# Patient Record
Sex: Female | Born: 1955 | Race: White | Hispanic: No
Health system: Midwestern US, Community
[De-identification: ages and names within clinical notes are randomized; demographics above are authoritative.]

## PROBLEM LIST (undated history)

## (undated) DIAGNOSIS — K219 Gastro-esophageal reflux disease without esophagitis: Secondary | ICD-10-CM

## (undated) DIAGNOSIS — R011 Cardiac murmur, unspecified: Secondary | ICD-10-CM

## (undated) DIAGNOSIS — I10 Essential (primary) hypertension: Secondary | ICD-10-CM

## (undated) DIAGNOSIS — F419 Anxiety disorder, unspecified: Secondary | ICD-10-CM

## (undated) DIAGNOSIS — I4891 Unspecified atrial fibrillation: Secondary | ICD-10-CM

## (undated) DIAGNOSIS — O021 Missed abortion: Secondary | ICD-10-CM

## (undated) DIAGNOSIS — N6320 Unspecified lump in the left breast, unspecified quadrant: Secondary | ICD-10-CM

## (undated) DIAGNOSIS — R928 Other abnormal and inconclusive findings on diagnostic imaging of breast: Secondary | ICD-10-CM

## (undated) DIAGNOSIS — J069 Acute upper respiratory infection, unspecified: Secondary | ICD-10-CM

## (undated) HISTORY — PX: APPENDECTOMY: SHX54

## (undated) HISTORY — PX: EYE SURGERY: SHX253

## (undated) HISTORY — DX: Cardiac murmur, unspecified: R01.1

## (undated) HISTORY — PX: TONSILLECTOMY: SUR1361

## (undated) HISTORY — PX: COLONOSCOPY: SHX174

## (undated) HISTORY — PX: DIAGNOSTIC LAPAROSCOPY: SUR761

## (undated) HISTORY — PX: DILATION AND CURETTAGE OF UTERUS: SHX78

## (undated) HISTORY — PX: WISDOM TOOTH EXTRACTION: SHX21

## (undated) HISTORY — PX: TUBAL LIGATION: SHX77

## (undated) HISTORY — PX: ECTOPIC PREGNANCY SURGERY: SHX613

---

## 1998-08-18 ENCOUNTER — Encounter: Admission: RE | Admit: 1998-08-18 | Discharge: 1998-08-18 | Payer: Self-pay | Admitting: *Deleted

## 1999-04-28 ENCOUNTER — Other Ambulatory Visit: Admission: RE | Admit: 1999-04-28 | Discharge: 1999-04-28 | Payer: Self-pay | Admitting: Obstetrics and Gynecology

## 1999-09-14 ENCOUNTER — Encounter: Admission: RE | Admit: 1999-09-14 | Discharge: 1999-09-14 | Payer: Self-pay | Admitting: Family Medicine

## 1999-10-26 ENCOUNTER — Encounter: Admission: RE | Admit: 1999-10-26 | Discharge: 1999-10-26 | Payer: Self-pay | Admitting: Family Medicine

## 2000-02-23 ENCOUNTER — Other Ambulatory Visit: Admission: RE | Admit: 2000-02-23 | Discharge: 2000-02-23 | Payer: Self-pay | Admitting: Obstetrics and Gynecology

## 2000-03-29 ENCOUNTER — Encounter: Admission: RE | Admit: 2000-03-29 | Discharge: 2000-03-29 | Payer: Self-pay | Admitting: Orthopedic Surgery

## 2000-03-29 ENCOUNTER — Encounter: Payer: Self-pay | Admitting: Orthopedic Surgery

## 2003-02-26 ENCOUNTER — Other Ambulatory Visit: Admission: RE | Admit: 2003-02-26 | Discharge: 2003-02-26 | Payer: Self-pay | Admitting: Obstetrics and Gynecology

## 2004-11-19 ENCOUNTER — Other Ambulatory Visit: Admission: RE | Admit: 2004-11-19 | Discharge: 2004-11-19 | Payer: Self-pay | Admitting: Obstetrics and Gynecology

## 2007-03-25 LAB — METABOLIC PANEL, BASIC
Anion gap: 6 — ABNORMAL LOW (ref 7–16)
BUN: 13 MG/DL (ref 7–18)
CO2: 30 MMOL/L (ref 21–32)
Calcium: 9.1 MG/DL (ref 8.4–10.4)
Chloride: 104 MMOL/L (ref 98–107)
Creatinine: 0.7 MG/DL (ref 0.6–1.0)
GFR est AA: >60 mL/min/{1.73_m2} (ref >60)
GFR est non-AA: >60 mL/min/{1.73_m2} (ref >60)
Glucose: 92 MG/DL (ref 74–106)
Potassium: 3.7 MMOL/L (ref 3.5–5.1)
Sodium: 140 MMOL/L (ref 136–145)

## 2007-03-25 LAB — CBC WITH AUTOMATED DIFF
ABS. BASOPHILS: 0 10*3/uL (ref 0.0–0.2)
ABS. EOSINOPHILS: 0.1 10*3/uL (ref 0.00–0.80)
ABS. LYMPHOCYTES: 3.6 10*3/uL (ref 0.6–4.3)
ABS. MONOCYTES: 0.4 10*3/uL (ref 0.1–0.9)
ABS. NEUTROPHILS: 5.3 10*3/uL (ref 1.9–7.8)
ABSOLUTE LUC: 0.1 (ref 0.0–0.3)
BASOPHILS: 0 % — ABNORMAL LOW (ref 0.1–1.6)
EOSINOPHILS: 2 % (ref 0.5–7.8)
HCT: 45.3 % — ABNORMAL HIGH (ref 35.6–45.0)
HGB: 15.2 GM/DL — ABNORMAL HIGH (ref 11.7–15.0)
LGE UNSTAINED CELLS: 1.3 % (ref 0.9–3.1)
LYMPHOCYTES: 38 % (ref 14.7–41.3)
MCH: 29.7 PG (ref 26.1–32.9)
MCHC: 33.6 GM/DL (ref 31.4–35.0)
MCV: 88.3 FL (ref 79.6–97.8)
MONOCYTES: 4 % (ref 3.2–9.0)
NEUTROPHILS: 55 % (ref 47.0–74.6)
PLATELET: 296 10*3/uL (ref 140–440)
RBC: 5.13 M/uL (ref 3.86–5.18)
RDW: 13.3 % (ref 11.9–14.6)
WBC: 9.6 10*3/uL (ref 4.5–10.5)

## 2008-04-29 MED ORDER — HYDROMORPHONE 2 MG/ML INJECTION SOLUTION
2 mg/mL | INTRAMUSCULAR | Status: AC
Start: 2008-04-29 — End: 2008-04-29
  Administered 2008-04-29: 23:00:00 via INTRAMUSCULAR

## 2008-04-29 MED ORDER — DICLOFENAC 75 MG TAB, DELAYED RELEASE
75 mg | ORAL_TABLET | Freq: Two times a day (BID) | ORAL | Status: AC
Start: 2008-04-29 — End: 2009-04-24

## 2008-04-29 NOTE — ED Notes (Signed)
Bedside report received from Kim, RN.

## 2008-04-29 NOTE — ED Notes (Signed)
Vital signs rechecked.  Patient reassured that we would get her back soon and to notify if she needed anything else.  Patient agrees with plan.

## 2008-04-29 NOTE — ED Notes (Signed)
Bedside report to Cayce, RN.
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
## 2008-04-29 NOTE — ED Notes (Signed)
Pt awake alert voices understanding of RX and discharge instructions

## 2008-04-29 NOTE — ED Notes (Signed)
Advised of delay for a room. Also advised if they feel their condition changes, or they request a recheck, to please present to registration and I will gladly re-assess. Requested u/a.

## 2009-06-15 LAB — BLOOD GAS ARTERIAL,VENT
ALLENS TEST: POSITIVE
Arterial O2 Hgb: 90.5 % — ABNORMAL LOW (ref 94.0–97.0)
BASE EXCESS: 3.9 mmol/L — ABNORMAL HIGH (ref 0–3)
BICARBONATE: 30 mmol/L — ABNORMAL HIGH (ref 22.0–26.0)
CARBOXYHEMOGLOBIN: 1.1 % (ref 0.5–1.5)
DEOXYHEMOGLOBIN: 8 % — ABNORMAL HIGH (ref 0.0–5.0)
FIO2: 21 %
HEMATOCRIT: 39 % (ref 37–50)
METHEMOGLOBIN: 0.2 % (ref 0.0–1.5)
O2 SAT: 92 % (ref 92.0–98.5)
PCO2: 52 mmHg — ABNORMAL HIGH (ref 35.0–45.0)
PO2: 63 mmHg — ABNORMAL LOW (ref 75.0–100.0)
TOTAL HEMOGLOBIN: 13.4 GM/DL (ref 12.0–18.0)
pH: 7.38 (ref 7.35–7.45)

## 2009-06-15 LAB — CBC W/O DIFF
HCT: 41.9 % (ref 37.6–48.3)
HGB: 12.8 g/dL (ref 11.7–15.0)
MCH: 29.4 PG (ref 26.1–32.9)
MCHC: 30.5 g/dL — ABNORMAL LOW (ref 31.4–35.0)
MCV: 96.1 FL (ref 79.6–97.8)
MPV: 10.2 FL — ABNORMAL LOW (ref 10.8–14.1)
PLATELET: 233 10*3/uL (ref 140–440)
RBC: 4.36 M/uL (ref 3.86–5.18)
RDW: 13.5 % (ref 11.9–14.6)
WBC: 7.9 10*3/uL (ref 4.0–10.5)

## 2009-06-15 LAB — METABOLIC PANEL, COMPREHENSIVE
A-G Ratio: 1 — ABNORMAL LOW (ref 1.2–3.5)
ALT (SGPT): 46 U/L (ref 39–65)
AST (SGOT): 20 U/L (ref 15–37)
Albumin: 3.3 g/dL — ABNORMAL LOW (ref 3.5–5.0)
Alk. phosphatase: 88 U/L (ref 50–136)
Anion gap: 3 mmol/L — ABNORMAL LOW (ref 7–16)
BUN: 16 MG/DL (ref 7–18)
Bilirubin, total: 0.3 MG/DL (ref 0.2–1.1)
CO2: 30 MMOL/L (ref 21–32)
Calcium: 8.1 MG/DL — ABNORMAL LOW (ref 8.4–10.4)
Chloride: 105 MMOL/L (ref 98–107)
Creatinine: 1.1 MG/DL — ABNORMAL HIGH (ref 0.6–1.0)
GFR est AA: >60 mL/min/{1.73_m2} (ref >60)
GFR est non-AA: 56 mL/min/{1.73_m2} — ABNORMAL LOW (ref >60)
Globulin: 3.4 g/dL (ref 2.3–3.5)
Glucose: 103 MG/DL (ref 74–106)
Potassium: 3.8 MMOL/L (ref 3.5–5.1)
Protein, total: 6.7 g/dL (ref 6.3–8.2)
Sodium: 138 MMOL/L (ref 136–145)

## 2009-06-15 LAB — AMMONIA: Ammonia, plasma: 28 umol/L (ref 11–32)

## 2009-06-15 LAB — PROTHROMBIN TIME + INR
INR: 1 (ref 0.9–1.2)
Prothrombin time: 9.7 SECS (ref 9.4–10.8)

## 2009-06-15 LAB — LIPASE: Lipase: 126 U/L (ref 73–393)

## 2009-06-15 LAB — BNP: BNP: 25 pg/mL

## 2009-06-15 LAB — ACETAMINOPHEN: Acetaminophen level: <1 ug/mL — ABNORMAL LOW (ref 10.0–30.0)

## 2009-06-15 LAB — D DIMER: D DIMER: 0.52 ug/ml(FEU) (ref <0.55)

## 2009-06-15 LAB — D-DIMER, QUANTITATIVE: D-Dimer, Quant: 0.52 ug/ml(FEU) (ref <0.55)

## 2009-06-15 MED ORDER — SALINE PERIPHERAL FLUSH PRN
INTRAMUSCULAR | Status: DC | PRN
Start: 2009-06-15 — End: 2009-06-15

## 2009-06-15 MED ORDER — SALINE PERIPHERAL FLUSH Q8H
Freq: Three times a day (TID) | INTRAMUSCULAR | Status: DC
Start: 2009-06-15 — End: 2009-06-15

## 2009-06-15 MED ORDER — FUROSEMIDE 40 MG TAB
40 mg | ORAL_TABLET | Freq: Every day | ORAL | Status: AC
Start: 2009-06-15 — End: 2009-07-05

## 2009-06-15 MED ORDER — FAMOTIDINE 20 MG TAB
20 mg | ORAL | Status: AC
Start: 2009-06-15 — End: 2009-06-15
  Administered 2009-06-15: 20:00:00 via ORAL

## 2009-06-15 MED ORDER — FUROSEMIDE 10 MG/ML IJ SOLN
10 mg/mL | INTRAMUSCULAR | Status: AC
Start: 2009-06-15 — End: 2009-06-15
  Administered 2009-06-15: 20:00:00 via INTRAVENOUS

## 2009-06-15 MED ORDER — GI COCKTAIL
ORAL | Status: AC
Start: 2009-06-15 — End: 2009-06-15
  Administered 2009-06-15: 20:00:00 via ORAL

## 2009-06-15 MED FILL — GI COCKTAIL: ORAL | Qty: 30

## 2009-06-15 MED FILL — FUROSEMIDE 10 MG/ML IJ SOLN: 10 mg/mL | INTRAMUSCULAR | Qty: 4

## 2009-06-15 MED FILL — FAMOTIDINE 20 MG TAB: 20 mg | ORAL | Qty: 1

## 2009-06-15 NOTE — ED Notes (Signed)
Family reports she has also been having dizziness for about a week, or so. Also having memory problems- for instance, she couldn't remember where she put her pocketbook today, or that she had ironed a shirt for her brother. Pt does not feel this is an issue, but family is quite persistent.

## 2009-06-15 NOTE — ED Provider Notes (Signed)
HPI Comments: Cynthia Bauer is a 53 y.o. Caucasian female in NAD presents with c/o epigastric pain with N&V&D onset last PM. Pt c/o dizziness for a week. She also has develop swelling of her body. Pt is on lg doses of Lortab for back pain.      Patient is a 53 y.o. female presenting with epigastric pain. The history is provided by the patient.   Epigastric Pain   This is a new problem. The current episode started 12 to 24 hours ago. The problem occurs constantly. The problem has not changed since onset. The pain is located in the generalized abdominal region and epigastric region. The quality of the pain is sharp. The pain is at a severity of 8/10. Associated symptoms include diarrhea, nausea and vomiting. Pertinent negatives include no fever, no dysuria, no frequency and no chest pain. The pain is worsened by palpation. The pain is relieved by nothing.        Past Medical History   Diagnosis Date   ??? Hypertension    ??? Other ill-defined conditions      enlarged heart.    ??? Gastrointestinal disorder      GERD          Past Surgical History   Procedure Date   ??? Hx gyn      hyst '87,, BTL           No family history on file.     History   Social History   ??? Marital Status: Divorced     Spouse Name: N/A     Number of Children: N/A   ??? Years of Education: N/A   Occupational History   ??? Not on file.   Social History Main Topics   ??? Tobacco Use: Never   ??? Alcohol Use: No   ??? Drug Use:    ??? Sexually Active:    Other Topics Concern   ??? Not on file   Social History Narrative   ??? No narrative on file           ALLERGIES: Review of patient's allergies indicates no known allergies.      Review of Systems   Constitutional: Positive for chills. Negative for fever.   Respiratory: Negative for shortness of breath.    Cardiovascular: Positive for leg swelling. Negative for chest pain.   Gastrointestinal: Positive for nausea, vomiting, abdominal pain and diarrhea.   Genitourinary: Negative for dysuria and frequency.    Skin: Negative for color change.   Neurological: Positive for dizziness. Negative for speech difficulty.   All other systems reviewed and are negative.        Filed Vitals:    06/15/2009  2:11 PM 06/15/2009  2:13 PM   BP: 115/88    Pulse: 100    Temp: 98.7 ??F (37.1 ??C)    Resp: 18    Height:  5\' 6"  (1.676 m)   Weight:  260 lb (117.935 kg)   SpO2: 100%               Physical Exam   Nursing note and vitals reviewed.  Constitutional: She is oriented to person, place, and time. She appears well-developed and well-nourished. No distress.   HENT:   Head: Normocephalic and atraumatic.   Right Ear: External ear normal.   Left Ear: External ear normal.   Nose: Nose normal.   Mouth/Throat: Oropharynx is clear and moist.   Eyes: Conjunctivae and extraocular motions are normal. No scleral icterus.  Neck: Normal range of motion. Neck supple.   Cardiovascular: Normal rate, regular rhythm and intact distal pulses.    Pulmonary/Chest: Effort normal and breath sounds normal. No respiratory distress.   Abdominal: Normal appearance. There is no organomegaly, splenomegaly or hepatomegaly. Generalized and tenderness is present. She has guarding. She has no rigidity, no rebound, no CVA tenderness, no pain at McBurney's point and no Murphy's sign.       Musculoskeletal: Normal range of motion. She exhibits no tenderness.        Right ankle: She exhibits swelling.        Left ankle: She exhibits swelling.        Right lower leg: She exhibits edema.        Left lower leg: She exhibits edema.   Neurological: She is alert and oriented to person, place, and time. She has normal strength. No sensory deficit.   Skin: Skin is warm and dry. No rash noted.   Psychiatric: She has a normal mood and affect. Her speech is normal and behavior is normal. Thought content normal.        MDM Coding   Reviewed: nursing note and vitals  Interpretation: labs, ECG and x-ray        Procedures    Recent Results (from the past 24 hour(s))   CBC W/O DIFF     Collection Time 06/15/09  3:40 PM   Component Value Range   ??? WBC 7.9  4.0 - 10.5 (K/uL)   ??? RBC 4.36  3.86 - 5.18 (M/uL)   ??? HGB 12.8  11.7 - 15.0 (Khaalid Lefkowitz/dL)   ??? HCT 41.9  37.6 - 48.3 (%)   ??? MCV 96.1  79.6 - 97.8 (FL)   ??? MCH 29.4  26.1 - 32.9 (PG)   ??? MCHC 30.5 (*) 31.4 - 35.0 (Demarius Archila/dL)   ??? RDW 13.5  11.9 - 14.6 (%)   ??? PLATELET 233  140 - 440 (K/uL)   ??? MPV 10.2 (*) 10.8 - 14.1 (FL)   METABOLIC PANEL, COMPREHENSIVE    Collection Time 06/15/09  3:40 PM   Component Value Range   ??? Sodium 138  136 - 145 (MMOL/L)   ??? Potassium 3.8  3.5 - 5.1 (MMOL/L)   ??? Chloride 105  98 - 107 (MMOL/L)   ??? CO2 30  21 - 32 (MMOL/L)   ??? Anion gap 3 (*) 7 - 16 (mmol/L)   ??? Glucose 103  74 - 106 (MG/DL)   ??? BUN 16  7 - 18 (MG/DL)   ??? Creatinine 1.1 (*) 0.6 - 1.0 (MG/DL)   ??? GFR est AA >60  >60 (ml/min/1.52m2)   ??? GFR est non-AA 56 (*) >60 (ml/min/1.76m2)   ??? Calcium 8.1 (*) 8.4 - 10.4 (MG/DL)   ??? Bilirubin, total 0.3  0.2 - 1.1 (MG/DL)   ??? ALT 46  39 - 65 (U/L)   ??? AST 20  15 - 37 (U/L)   ??? Alk. phosphatase 88  50 - 136 (U/L)   ??? Protein, total 6.7  6.3 - 8.2 (Jericho Alcorn/dL)   ??? Albumin 3.3 (*) 3.5 - 5.0 (Terrina Docter/dL)   ??? Globulin 3.4  2.3 - 3.5 (Hashim Eichhorst/dL)   ??? A-Markeya Mincy Ratio 1.0 (*) 1.2 - 3.5 ( )   PROTHROMBIN TIME    Collection Time 06/15/09  3:40 PM   Component Value Range   ??? Prothrombin Time-PT 9.7  9.4 - 10.8 (SECS)   ??? INR 1.0  0.9 - 1.2 ( )  LIPASE    Collection Time 06/15/09  3:40 PM   Component Value Range   ??? Lipase 126  73 - 393 (U/L)   BNP    Collection Time 06/15/09  3:40 PM   Component Value Range   ??? BNP 25  (pg/mL)   ACETAMINOPHEN    Collection Time 06/15/09  3:40 PM   Component Value Range   ??? ACETAMINOPHEN <1 (*) 10.0 - 30.0 (ug/mL)   D DIMER    Collection Time 06/15/09  3:40 PM   Component Value Range   ??? D DIMER 0.52  <0.55 (ug/ml(FEU))   AMMONIA    Collection Time 06/15/09  3:45 PM   Component Value Range   ??? Ammonia 28  11 - 32 (UMOL/L)   BLOOD GAS, ARTERIAL    Collection Time 06/15/09  5:18 PM   Component Value Range   ??? pH 7.38  7.35 - 7.45 ( )    ??? PCO2 52 (*) 35.0 - 45.0 (mmHg)   ??? PO2 63 (*) 75.0 - 100.0 (mmHg)   ??? BICARBONATE 30 (*) 22.0 - 26.0 (mmol/L)   ??? BASE EXCESS 3.9 (*) 0 - 3 (mmol/L)   ??? TOTAL HEMOGLOBIN 13.4  12.0 - 18.0 (GM/DL)   ??? O2 SAT 92  92.0 - 98.5 (%)   ??? ARTERIAL O2 HGB 90.5 (*) 94.0 - 97.0 (%)   ??? CARBOXYHEMOGLOBIN 1.1  0.5 - 1.5 (%)   ??? METHEMOGLOBIN 0.2  0.0 - 1.5 (%)   ??? DEOXYHEMOGLOBIN 8 (*) 0.0 - 5.0 (%)   ??? HEMATOCRIT 39  37 - 50 (%)   ??? SITE LR     ??? ALLENS TEST POSITIVE     ??? MODE RA     ??? FIO2 21.0  (%)        Cardiac enzymes are neg  BNP is neg  CXR shows no acute changes

## 2009-06-15 NOTE — ED Notes (Signed)
I have reviewed discharge instructions with the patient.  The patient verbalized understanding.

## 2009-09-12 LAB — METABOLIC PANEL, COMPREHENSIVE
A-G Ratio: 1 — ABNORMAL LOW (ref 1.2–3.5)
ALT (SGPT): 41 U/L (ref 39–65)
AST (SGOT): 16 U/L (ref 15–37)
Albumin: 3.9 g/dL (ref 3.5–5.0)
Alk. phosphatase: 107 U/L (ref 50–136)
Anion gap: 10 mmol/L (ref 7–16)
BUN: 8 MG/DL (ref 7–18)
Bilirubin, total: 0.5 MG/DL (ref 0.2–1.1)
CO2: 26 MMOL/L (ref 21–32)
Calcium: 8.7 MG/DL (ref 8.4–10.4)
Chloride: 103 MMOL/L (ref 98–107)
Creatinine: 1 MG/DL (ref 0.6–1.0)
GFR est AA: >60 mL/min/{1.73_m2} (ref >60)
GFR est non-AA: >60 mL/min/{1.73_m2} (ref >60)
Globulin: 3.8 g/dL — ABNORMAL HIGH (ref 2.3–3.5)
Glucose: 131 MG/DL — ABNORMAL HIGH (ref 74–106)
Potassium: 3.1 MMOL/L — ABNORMAL LOW (ref 3.5–5.1)
Protein, total: 7.7 g/dL (ref 6.3–8.2)
Sodium: 139 MMOL/L (ref 136–145)

## 2009-09-12 LAB — CBC WITH AUTOMATED DIFF
ABS. BASOPHILS: 0 10*3/uL (ref 0.0–0.2)
ABS. EOSINOPHILS: 0.1 10*3/uL (ref 0.0–0.8)
ABS. IMM. GRANS.: 0 10*3/uL (ref 0.0–2.0)
ABS. LYMPHOCYTES: 2.6 10*3/uL (ref 0.5–4.6)
ABS. MONOCYTES: 0.4 10*3/uL (ref 0.1–1.3)
ABS. NEUTROPHILS: 4.1 10*3/uL (ref 1.7–8.2)
BASOPHILS: 0 % (ref 0.0–2.0)
EOSINOPHILS: 1 % (ref 0.5–7.8)
HCT: 49.1 % — ABNORMAL HIGH (ref 37.6–48.3)
HGB: 15.8 g/dL — ABNORMAL HIGH (ref 11.7–15.0)
IMMATURE GRANULOCYTES: 0.3 % (ref 0.0–2.0)
LYMPHOCYTES: 36 % (ref 13–44)
MCH: 29.8 PG (ref 26.1–32.9)
MCHC: 32.2 g/dL (ref 31.4–35.0)
MCV: 92.5 FL (ref 79.6–97.8)
MONOCYTES: 5 % (ref 4.0–12.0)
MPV: 10.9 FL (ref 10.8–14.1)
NEUTROPHILS: 58 % (ref 43–78)
PLATELET: 285 10*3/uL (ref 140–440)
RBC: 5.31 M/uL — ABNORMAL HIGH (ref 3.86–5.18)
RDW: 13.1 % (ref 11.9–14.6)
WBC: 7.2 10*3/uL (ref 4.0–10.5)

## 2009-09-12 LAB — LIPASE: Lipase: 126 U/L (ref 73–393)

## 2009-09-12 LAB — AMYLASE: Amylase: 34 U/L (ref 25–115)

## 2009-09-12 MED ORDER — DIATRIZOATE MEGLUMINE & SODIUM 66 %-10 % ORAL SOLN
66-10 % | Freq: Once | ORAL | Status: AC
Start: 2009-09-12 — End: 2009-09-12
  Administered 2009-09-12: 20:00:00 via ORAL

## 2009-09-12 MED ORDER — PANTOPRAZOLE 40 MG TAB, DELAYED RELEASE
40 mg | ORAL | Status: AC
Start: 2009-09-12 — End: 2009-09-12
  Administered 2009-09-12: 18:00:00 via ORAL

## 2009-09-12 MED ORDER — HYDROMORPHONE (PF) 1 MG/ML IJ SOLN
1 mg/mL | INTRAMUSCULAR | Status: AC
Start: 2009-09-12 — End: 2009-09-12
  Administered 2009-09-12: 16:00:00 via INTRAVENOUS

## 2009-09-12 MED ORDER — HYDROMORPHONE (PF) 1 MG/ML IJ SOLN
1 mg/mL | INTRAMUSCULAR | Status: AC
Start: 2009-09-12 — End: 2009-09-12
  Administered 2009-09-12: 18:00:00 via INTRAVENOUS

## 2009-09-12 MED ORDER — IOVERSOL 350 MG/ML IV SOLN
350 mg iodine/mL | Freq: Once | INTRAVENOUS | Status: AC
Start: 2009-09-12 — End: 2009-09-12
  Administered 2009-09-12: 20:00:00 via INTRAVENOUS

## 2009-09-12 MED ORDER — ONDANSETRON (PF) 4 MG/2 ML INJECTION
4 mg/2 mL | INTRAMUSCULAR | Status: AC
Start: 2009-09-12 — End: 2009-09-12
  Administered 2009-09-12: 16:00:00 via INTRAVENOUS

## 2009-09-12 MED ORDER — OMEPRAZOLE 20 MG CAP, DELAYED RELEASE
20 mg | ORAL_CAPSULE | Freq: Every day | ORAL | Status: AC
Start: 2009-09-12 — End: 2010-09-07

## 2009-09-12 MED ORDER — ONDANSETRON (PF) 4 MG/2 ML INJECTION
4 mg/2 mL | INTRAMUSCULAR | Status: AC
Start: 2009-09-12 — End: 2009-09-12
  Administered 2009-09-12: 18:00:00 via INTRAVENOUS

## 2009-09-12 MED FILL — PROTONIX 40 MG TABLET,DELAYED RELEASE: 40 mg | ORAL | Qty: 1

## 2009-09-12 MED FILL — HYDROMORPHONE (PF) 1 MG/ML IJ SOLN: 1 mg/mL | INTRAMUSCULAR | Qty: 1

## 2009-09-12 MED FILL — ONDANSETRON (PF) 4 MG/2 ML INJECTION: 4 mg/2 mL | INTRAMUSCULAR | Qty: 2

## 2009-09-12 NOTE — ED Notes (Signed)
Pt states "feels much better after being place on oxygen". Pt nad

## 2009-09-12 NOTE — ED Notes (Signed)
Report taken at bedside from Christy,RN and AIDET preformed.

## 2009-09-12 NOTE — ED Notes (Signed)
Pt aware that CT results still not returned yet and is in NAD at this time

## 2009-09-12 NOTE — ED Notes (Signed)
Normal [place] and ab series. Heme negative and normal pancreatic enzymes. Plan to treat abdominal gastritis and dc.

## 2009-09-12 NOTE — ED Notes (Signed)
Lab called to check on blood work. States "they have it down there"

## 2009-09-12 NOTE — ED Notes (Signed)
I have reviewed discharge instructions with the patient.  The patient verbalized understanding. Pt ambulatory. Family driving her home. Pt nad

## 2009-09-12 NOTE — ED Notes (Signed)
Pt resting in bed. States "feels much better". Pt nad

## 2009-09-12 NOTE — ED Notes (Signed)
Pt finished PO contrast

## 2009-09-12 NOTE — ED Notes (Signed)
Po contrast given to pt.

## 2009-09-12 NOTE — ED Provider Notes (Signed)
HPI Comments: 26 wf complains of severe epigastric pain that started yesterday. She has had chronic pain for over a year but yesterday and today it is much worse. No fever and no history of black or dark stools. She is on prilosec for GERD.    Patient is a 53 y.o. female presenting with epigastric pain. The history is provided by the patient and a relative.   Epigastric Pain   This is a chronic (over one year of pain) problem. The problem has been gradually worsening. The pain is associated with an unknown factor. The pain is located in the epigastric region. The quality of the pain is aching and sharp. The pain is at a severity of 9/10. The pain is severe. Associated symptoms include anorexia and nausea. Pertinent negatives include no fever, no belching, no diarrhea, no flatus, no hematochezia, no melena, no vomiting, no constipation, no dysuria, no frequency, no trauma and no chest pain. Nothing worsens the pain. The pain is relieved by nothing. Her past medical history is significant for GERD. The patient's surgical history includes hysterectomy.       Past Medical History   Diagnosis Date   ??? Hypertension    ??? Other ill-defined conditions      enlarged heart.    ??? Gastrointestinal disorder      GERD          Past Surgical History   Procedure Date   ??? Hx gyn      hyst '87,, BTL   ??? Hx orthopaedic      lumbar radiculopathy           No family history on file.     History   Social History   ??? Marital Status: Divorced     Spouse Name: N/A     Number of Children: N/A   ??? Years of Education: N/A   Occupational History   ??? Not on file.   Social History Main Topics   ??? Tobacco Use: Never   ??? Alcohol Use: No   ??? Drug Use:    ??? Sexually Active:    Other Topics Concern   ??? Not on file   Social History Narrative   ??? No narrative on file           ALLERGIES: Review of patient's allergies indicates no known allergies.      Review of Systems   Constitutional: Negative.  Negative for fever.   HENT: Negative.     Eyes: Negative.    Respiratory: Negative.  Negative for cough and shortness of breath.    Cardiovascular: Negative.  Negative for chest pain and leg swelling.   Gastrointestinal: Positive for nausea, abdominal pain and abdominal distention. Negative for vomiting, diarrhea, constipation, blood in stool, melena, hematochezia and anal bleeding.   Genitourinary: Negative for dysuria and frequency.   Musculoskeletal: Negative.    Skin: Negative.    Neurological: Negative.    Hematological: Negative.    Psychiatric/Behavioral: Negative.        Filed Vitals:    09/12/2009 10:35 AM 09/12/2009 10:45 AM 09/12/2009 10:46 AM 09/12/2009 11:00 AM   BP: 140/109 156/103  163/98   Pulse: 112  113 99   Temp: 98.2 ??F (36.8 ??C)      Resp: 20      Height: 5\' 7"  (1.702 m)      Weight: 270 lb (122.471 kg)      SpO2: 95%  97% 94%  Physical Exam   Nursing note and vitals reviewed.  Constitutional: She is oriented to person, place, and time. She appears well-developed and well-nourished. She appears distressed.   HENT:   Head: Normocephalic and atraumatic.   Right Ear: External ear normal.   Left Ear: External ear normal.   Nose: Nose normal.   Mouth/Throat: Oropharynx is clear and moist.   Eyes: Conjunctivae and extraocular motions are normal. Pupils are equal, round, and reactive to light.   Neck: Normal range of motion. Neck supple.   Cardiovascular: Normal rate, regular rhythm, normal heart sounds and intact distal pulses.    Pulmonary/Chest: Effort normal and breath sounds normal. No respiratory distress. She has no wheezes. She has no rales. She exhibits no tenderness.   Abdominal: Soft.             Decreased bowel sounds     Musculoskeletal: Normal range of motion.   Neurological: She is alert and oriented to person, place, and time.   Skin: Skin is warm and dry.        Coding    Procedures

## 2011-11-15 ENCOUNTER — Ambulatory Visit (INDEPENDENT_AMBULATORY_CARE_PROVIDER_SITE_OTHER): Payer: BC Managed Care – PPO

## 2011-11-15 DIAGNOSIS — J069 Acute upper respiratory infection, unspecified: Secondary | ICD-10-CM

## 2011-12-08 ENCOUNTER — Ambulatory Visit (INDEPENDENT_AMBULATORY_CARE_PROVIDER_SITE_OTHER): Payer: BC Managed Care – PPO | Admitting: Physician Assistant

## 2011-12-08 VITALS — BP 131/82 | HR 69 | Temp 98.0°F | Resp 16 | Ht 63.0 in | Wt 176.0 lb

## 2011-12-08 DIAGNOSIS — J329 Chronic sinusitis, unspecified: Secondary | ICD-10-CM

## 2011-12-08 DIAGNOSIS — I10 Essential (primary) hypertension: Secondary | ICD-10-CM

## 2011-12-08 MED ORDER — FLUTICASONE PROPIONATE 50 MCG/ACT NA SUSP: 2.0000 | Freq: Every day | NASAL | Status: DC

## 2011-12-08 MED ORDER — CEFDINIR 300 MG PO CAPS: 600.0000 mg | ORAL_CAPSULE | Freq: Every day | ORAL | Status: AC

## 2011-12-08 NOTE — Progress Notes (Signed)
  Subjective:    Patient ID: Cassandra Hicks, female    DOB: 01-27-1956, 56 y.o.   MRN: [id number]  HPI 56 y/o WF presents with URI symptoms ongoing for three weeks. Chills two nights ago, now resolved.  No objective fever. Nasal congestion, sinus pain, ear pain, right > left Went to [place] last week - plane ride, cold weather Minimal cough, no wheeze  AlkaSeltzer, Tylenol, sinus pills, nasal spray all without relief  Seen 11/15/2011 for similar symptoms, took azithromycin, improved slightly, but never completely resolved.    Review of Systems  Constitutional: Positive for fatigue. Negative for fever and chills.  HENT: Positive for ear pain (Right), sore throat, postnasal drip and sinus pressure. Negative for neck pain.   Respiratory: Negative for cough, shortness of breath and wheezing.   Neurological: Negative for dizziness.       Objective:   Physical Exam  Constitutional: She appears well-developed and well-nourished. No distress (but looks ill).  HENT:  Head: Normocephalic and atraumatic.  Right Ear: Tympanic membrane is scarred, erythematous and retracted. Tympanic membrane mobility is abnormal. A middle ear effusion is present.  Left Ear: Tympanic membrane and external ear normal.  Mouth/Throat: No oropharyngeal exudate.  Eyes: Pupils are equal, round, and reactive to light.  Neck: Normal range of motion.  Cardiovascular: Normal rate, regular rhythm and normal heart sounds.   Pulmonary/Chest: Effort normal and breath sounds normal.  Skin: Skin is warm and dry.          Assessment & Plan:  Sinusitis.  Right otitis media Treat with Omnicef 300mg  two tablets daily for 10 days.  Take all 10 days of antibiotics to prevent recurrence.  Use nasal spray (Flonase) daily for symptom relief.  May use ibuprofen (Advil) for myalgias and sinus pain.   If not improved after antibiotics, advise return for recheck.  Consider ENT referral or sinus films to evaluate for chronic  sinusitis.

## 2011-12-08 NOTE — Patient Instructions (Addendum)
Take all 10 days of antibiotics to prevent recurrence.  Use nasal spray daily for symptom relief.  May use ibuprofen (Advil) for myalgias and sinus pain.   Sinusitis Sinuses are air pockets within the bones of your face. The growth of bacteria within a sinus leads to infection. The infection prevents the sinuses from draining. This infection is called sinusitis. SYMPTOMS  There will be different areas of pain depending on which sinuses have become infected.  The maxillary sinuses often produce pain beneath the eyes.   Frontal sinusitis may cause pain in the middle of the forehead and above the eyes.  Other problems (symptoms) include:  Toothaches.   Colored, pus-like (purulent) drainage from the nose.   Swelling, warmth, and tenderness over the sinus areas may be signs of infection.  TREATMENT  Sinusitis is most often determined by an exam.X-rays may be taken. If x-rays have been taken, make sure you obtain your results or find out how you are to obtain them. Your caregiver may give you medications (antibiotics). These are medications that will help kill the bacteria causing the infection. You may also be given a medication (decongestant) that helps to reduce sinus swelling.  HOME CARE INSTRUCTIONS   Only take over-the-counter or prescription medicines for pain, discomfort, or fever as directed by your caregiver.   Drink extra fluids. Fluids help thin the mucus so your sinuses can drain more easily.   Applying either moist heat or ice packs to the sinus areas may help relieve discomfort.   Use saline nasal sprays to help moisten your sinuses. The sprays can be found at your local drugstore.  SEEK IMMEDIATE MEDICAL CARE IF:  You have a fever.   You have increasing pain, severe headaches, or toothache.   You have nausea, vomiting, or drowsiness.   You develop unusual swelling around the face or trouble seeing.  MAKE SURE YOU:   Understand these instructions.   Will watch your  condition.   Will get help right away if you are not doing well or get worse.  Document Released: 10/25/2005 Document Revised: 07/07/2011 Document Reviewed: 05/24/2007 [facility] Patient Information 2012 [place], [place].

## 2012-02-15 ENCOUNTER — Other Ambulatory Visit: Payer: Self-pay | Admitting: Physician Assistant

## 2012-03-16 ENCOUNTER — Other Ambulatory Visit: Payer: Self-pay | Admitting: Physician Assistant

## 2012-03-17 ENCOUNTER — Telehealth: Payer: Self-pay

## 2012-03-17 MED ORDER — METOPROLOL SUCCINATE ER 25 MG PO TB24: 25.0000 mg | ORAL_TABLET | Freq: Every day | ORAL | Status: DC

## 2012-03-17 NOTE — Telephone Encounter (Signed)
Can we give these refills or does she need to come into clinic first?

## 2012-03-17 NOTE — Telephone Encounter (Signed)
Spoke with patient and let her know rx was called in and she would need to rtc before getting more refills.

## 2012-03-17 NOTE — Telephone Encounter (Signed)
Pt out of htn meds and is going out of town this weekend.Uses [facility] on [address]. Advised she needs to make an appt but would like some called in to carry her over as she is going out of town.  Connelly  [phone]

## 2012-03-17 NOTE — Telephone Encounter (Signed)
done

## 2012-03-17 NOTE — Telephone Encounter (Signed)
LMOM to call back

## 2012-04-05 ENCOUNTER — Ambulatory Visit (INDEPENDENT_AMBULATORY_CARE_PROVIDER_SITE_OTHER): Payer: BC Managed Care – PPO | Admitting: Family Medicine

## 2012-04-05 VITALS — BP 165/99 | HR 65 | Temp 98.5°F | Resp 16 | Ht 62.5 in | Wt 179.2 lb

## 2012-04-05 DIAGNOSIS — F411 Generalized anxiety disorder: Secondary | ICD-10-CM

## 2012-04-05 DIAGNOSIS — R609 Edema, unspecified: Secondary | ICD-10-CM

## 2012-04-05 DIAGNOSIS — F419 Anxiety disorder, unspecified: Secondary | ICD-10-CM

## 2012-04-05 DIAGNOSIS — I10 Essential (primary) hypertension: Secondary | ICD-10-CM

## 2012-04-05 MED ORDER — METOPROLOL SUCCINATE ER 50 MG PO TB24: 50.0000 mg | ORAL_TABLET | Freq: Every day | ORAL | Status: DC

## 2012-04-05 MED ORDER — FLUOXETINE HCL 20 MG PO TABS: 20.0000 mg | ORAL_TABLET | Freq: Every day | ORAL | Status: DC

## 2012-04-05 MED ORDER — HYDROCHLOROTHIAZIDE 25 MG PO TABS: ORAL_TABLET | ORAL | Status: DC

## 2012-04-05 NOTE — Progress Notes (Signed)
@UMFCLOGO @  Patient ID: Cassandra Hicks MRN: [id number], DOB: 1956-01-31, 56 y.o. Date of Encounter: 04/05/2012, 6:20 PM  Primary Physician: No primary provider on file.  Chief Complaint: Medication refill   HPI: 56 y.o. year old female with history below presents for refill of  Doing well without issues or complaints. Taking medication daily without adverse effects. Has been on medication for  No past medical history on file.   Home Meds: Prior to Admission medications   Medication Sig Start Date End Date Taking? Authorizing Provider  metoprolol succinate (TOPROL-XL) 25 MG 24 hr tablet Take 1 tablet (25 mg total) by mouth daily. NEEDS OFFICE VISIT/LABS 03/17/12  Yes Ryan M Dunn, PA-C  fluticasone [facility]) 50 MCG/ACT nasal spray Place 2 sprays into the nose daily. 12/08/11 12/07/12  Windy Carina, PA-C    Allergies:  Allergies  Allergen Reactions  . Penicillins Itching    History   Social History  . Marital Status: Married    Spouse Name: N/A    Number of Children: N/A  . Years of Education: N/A   Occupational History  . Not on file.   Social History Main Topics  . Smoking status: Former Smoker    Quit date: 12/07/1996  . Smokeless tobacco: Not on file  . Alcohol Use: Not on file  . Drug Use: Not on file  . Sexually Active: Not on file   Other Topics Concern  . Not on file   Social History Narrative  . No narrative on file     Review of Systems: Constitutional: negative for chills, fever, night sweats, weight changes, or fatigue  HEENT: negative for vision changes, hearing loss, congestion, rhinorrhea, ST, epistaxis, or sinus pressure Cardiovascular: negative for chest pain or palpitations Respiratory: negative for hemoptysis, wheezing, shortness of breath, or cough Abdominal: negative for abdominal pain, nausea, vomiting, diarrhea, or constipation Dermatological: negative for rash Neurologic: negative for headache, dizziness, or syncope All other systems reviewed  and are otherwise negative with the exception to those above and in the HPI.   Physical Exam: Blood pressure 165/99, pulse 65, temperature 98.5 F (36.9 C), temperature source Oral, resp. rate 16, height 5' 2.5" (1.588 m), weight 179 lb 3.2 oz (81.285 kg)., Body mass index is 32.25 kg/(m^2). General: Well developed, well nourished, in no acute distress. Head: Normocephalic, atraumatic, eyes without discharge, sclera non-icteric, nares are without discharge. Bilateral auditory canals clear, TM's are without perforation, pearly grey and translucent with reflective cone of light bilaterally. Oral cavity moist, posterior pharynx without exudate, erythema, peritonsillar abscess, or post nasal drip.  Neck: Supple. No thyromegaly. Full ROM. No lymphadenopathy. Lungs: Clear bilaterally to auscultation without wheezes, rales, or rhonchi. Breathing is unlabored. Heart: RRR with S1 S2. No murmurs, rubs, or gallops appreciated. Abdomen: Soft, non-tender, non-distended with normoactive bowel sounds. No hepatosplenomegalymegaly. No rebound/guarding. No obvious abdominal masses. Msk:  Strength and tone normal for age. Extremities/Skin: Warm and dry. No clubbing or cyanosis. No edema. No rashes or suspicious lesions. Neuro: Alert and oriented X 3. Moves all extremities spontaneously. Gait is normal. CNII-XII grossly in tact. Psych:  Responds to questions appropriately with a normal affect.   Labs:   ASSESSMENT AND PLAN:   -  Signed, Elvina Sidle, MD 04/05/2012 6:20 PM   Patient had requested to see me in end up being seen by Dr. Milus Glazier  first. She's been having some problems with anxiety and she would like to go back on Prozac. Arms with a blood pressure and  some headaches. She also gets swelling in her feet.   Objective: No acute distress. Throat clear. Neck supple without nodes. Chest clear. Heart regular without murmurs.  Trace  edema.  Assessment: Hypertension Edema Anxiety  Plan: Prozac. She is trying to decide whether she wants to start back on do not, I will give her the prescription for 20 mg daily. Return in 6 weeks for recheck of her blood pressure. Increase metoprolol to 50 mg daily. Add hydrochlorothiazide 25 mg daily. Coliforms. Thank you

## 2012-04-05 NOTE — Patient Instructions (Signed)

## 2012-09-07 ENCOUNTER — Other Ambulatory Visit: Payer: Self-pay | Admitting: Family Medicine

## 2012-09-07 NOTE — Telephone Encounter (Signed)
Per last visit in May 2013, pt to follow-up in 6 weeks for recheck, needs labs/OV

## 2012-10-16 ENCOUNTER — Other Ambulatory Visit: Payer: Self-pay | Admitting: Physician Assistant

## 2012-11-12 ENCOUNTER — Ambulatory Visit (INDEPENDENT_AMBULATORY_CARE_PROVIDER_SITE_OTHER): Payer: BC Managed Care – PPO | Admitting: Family Medicine

## 2012-11-12 VITALS — BP 148/88 | HR 63 | Temp 98.0°F | Resp 16 | Ht 63.0 in | Wt 178.0 lb

## 2012-11-12 DIAGNOSIS — I10 Essential (primary) hypertension: Secondary | ICD-10-CM

## 2012-11-12 DIAGNOSIS — Z23 Encounter for immunization: Secondary | ICD-10-CM

## 2012-11-12 MED ORDER — METOPROLOL SUCCINATE ER 50 MG PO TB24: ORAL_TABLET | ORAL | Status: DC

## 2012-11-12 MED ORDER — HYDROCHLOROTHIAZIDE 25 MG PO TABS: 25.0000 mg | ORAL_TABLET | Freq: Every day | ORAL | Status: DC

## 2012-11-12 NOTE — Addendum Note (Signed)
Addended by: Celene Squibb on: 11/12/2012 03:25 PM   Modules accepted: Orders

## 2012-11-12 NOTE — Patient Instructions (Signed)
Return in 3-4 months for a physical.   Work hard on trying to eat less and exercise more.

## 2012-11-12 NOTE — Progress Notes (Signed)
Subjective: Patient is here for a regular visit with regard to her blood pressure medication. She has felt like her blood pressure was up some of the time based her headaches. She does have a good deal of stress in her job.  Objective: Moderately overweight white female in no major distress. Chest clear. Heart regular without murmurs. Repeat blood pressure was 152 over 90   Assessment: Hypertension  Plan: Return for a physical sometime later in the spring. Increase the metoprolol to 100 mg daily (50 mg twice a day)

## 2013-07-25 ENCOUNTER — Other Ambulatory Visit: Payer: Self-pay | Admitting: Family Medicine

## 2013-08-23 ENCOUNTER — Other Ambulatory Visit: Payer: Self-pay | Admitting: Family Medicine

## 2013-09-17 ENCOUNTER — Ambulatory Visit (INDEPENDENT_AMBULATORY_CARE_PROVIDER_SITE_OTHER): Payer: BC Managed Care – PPO | Admitting: Family Medicine

## 2013-09-17 VITALS — BP 120/80 | HR 60 | Temp 98.3°F | Resp 16 | Ht 63.0 in | Wt 181.0 lb

## 2013-09-17 DIAGNOSIS — I10 Essential (primary) hypertension: Secondary | ICD-10-CM

## 2013-09-17 DIAGNOSIS — Z13220 Encounter for screening for lipoid disorders: Secondary | ICD-10-CM

## 2013-09-17 LAB — POCT CBC
Granulocyte percent: 51.7 %G (ref 37–80)
HCT, POC: 42.8 % (ref 37.7–47.9)
Hemoglobin: 13.6 g/dL (ref 12.2–16.2)
MCV: 97.4 fL — AB (ref 80–97)
POC LYMPH PERCENT: 41 %L (ref 10–50)
RBC: 4.39 M/uL (ref 4.04–5.48)

## 2013-09-17 MED ORDER — HYDROCHLOROTHIAZIDE 25 MG PO TABS: 25.0000 mg | ORAL_TABLET | Freq: Every day | ORAL | Status: DC

## 2013-09-17 MED ORDER — METOPROLOL SUCCINATE ER 50 MG PO TB24: ORAL_TABLET | ORAL | Status: DC

## 2013-09-17 NOTE — Progress Notes (Signed)
[facility] [address] [place] [address] [phone]  Date:  09/17/2013   Name:  Cassandra Hicks   DOB:  25-Nov-1955   MRN:  [id number]  PCP:  Janace Hoard, MD    Chief Complaint: Medication Refill   History of Present Illness:  Cassandra Hicks is a 57 y.o. very pleasant female patient who presents with the following:  She is here today for a BP medication refill.   She has decreased her toprol to 50 mg a day.  Reports that her BP has looked ok; she has it checked at work by the nurse and her control has been very good.   She last ate about 6 hours ago.    Her GM did have heart disease but she is not aware of any other family history of cardiac problems.    Overall she is feeling well but admits that she needs to catch up on her mammogram and colonoscopy  Patient Active Problem List   Diagnosis Date Noted  . HTN (hypertension) 12/08/2011    Past Medical History  Diagnosis Date  . Heart murmur     Past Surgical History  Procedure Laterality Date  . Appendectomy    . Eye surgery    . Ectopic pregnancy surgery      History  Substance Use Topics  . Smoking status: Former Smoker    Quit date: 12/07/1996  . Smokeless tobacco: Not on file  . Alcohol Use: Yes    Family History  Problem Relation Age of Onset  . COPD Mother   . Cancer Father     lung  . Hypertension Father   . Hypertension Sister   . Hypertension Brother     Allergies  Allergen Reactions  . Penicillins Itching    Medication list has been reviewed and updated.  Current Outpatient Prescriptions on File Prior to Visit  Medication Sig Dispense Refill  . hydrochlorothiazide (HYDRODIURIL) 25 MG tablet Take 1 tablet (25 mg total) by mouth daily. PATIENT NEEDS OFFICE VISIT FOR ADDITIONAL REFILLS - 2nd NOTICE  15 tablet  0  . metoprolol succinate (TOPROL-XL) 50 MG 24 hr tablet One twice daily for blood pressure  60 tablet  5  . FLUoxetine (PROZAC) 20 MG tablet Take 1 tablet  (20 mg total) by mouth daily.  30 tablet  3  . fluticasone (FLONASE) 50 MCG/ACT nasal spray Place 2 sprays into the nose daily.  16 g  6   No current facility-administered medications on file prior to visit.    Review of Systems:  As per HPI- otherwise negative.   Physical Examination: Filed Vitals:   09/17/13 1751  BP: 120/80  Pulse: 60  Temp: 98.3 F (36.8 C)  Resp: 16   Filed Vitals:   09/17/13 1751  Height: 5\' 3"  (1.6 m)  Weight: 181 lb (82.101 kg)   Body mass index is 32.07 kg/(m^2). Ideal Body Weight: Weight in (lb) to have BMI = 25: 140.8  GEN: WDWN, NAD, Non-toxic, A & O x 3, overweight, looks well HEENT: Atraumatic, Normocephalic. Neck supple. No masses, No LAD. Ears and Nose: No external deformity. CV: RRR, No M/G/R. No JVD. No thrill. No extra heart sounds. PULM: CTA B, no wheezes, crackles, rhonchi. No retractions. No resp. distress. No accessory muscle use. EXTR: No c/c/e NEURO Normal gait.  PSYCH: Normally interactive. Conversant. Not depressed or anxious appearing.  Calm demeanor. ;   Assessment and Plan: HTN (hypertension) - Plan: hydrochlorothiazide (  HYDRODIURIL) 25 MG tablet, metoprolol succinate (TOPROL-XL) 50 MG 24 hr tablet, POCT CBC, Comprehensive metabolic panel, TSH  Screening for hyperlipidemia - Plan: Lipid panel  Refilled her medications Labs pending as above.   Gave hand- out regarding calcium and vit d See patient instructions for more details.     Signed Abbe Amsterdam, MD

## 2013-09-17 NOTE — Patient Instructions (Signed)
I will be in touch with you regarding your labs.   Let's plan to recheck in about 6 months.    Please get back in touch with Dr. Loreta Ave about your colonoscopy  [facility]: Anselmo Rod MD Address: [address], [place], [place] [address] [phone]  Also, get your mammogram caught up when you can!

## 2013-09-18 ENCOUNTER — Encounter: Payer: Self-pay | Admitting: Family Medicine

## 2013-09-18 LAB — COMPREHENSIVE METABOLIC PANEL
Albumin: 4.1 g/dL (ref 3.5–5.2)
BUN: 12 mg/dL (ref 6–23)
CO2: 26 mEq/L (ref 19–32)
Calcium: 9.2 mg/dL (ref 8.4–10.5)
Chloride: 107 mEq/L (ref 96–112)
Glucose, Bld: 79 mg/dL (ref 70–99)
Potassium: 4.3 mEq/L (ref 3.5–5.3)

## 2013-09-18 LAB — LIPID PANEL
HDL: 46 mg/dL (ref >39)
Triglycerides: 241 mg/dL — ABNORMAL HIGH (ref <150)

## 2013-09-24 ENCOUNTER — Telehealth: Payer: Self-pay

## 2013-09-24 NOTE — Telephone Encounter (Signed)
Patient would like a copy of her lab results from last week.  After Dr Patsy Lager has reviewed the lab results.   Best#: [phone]

## 2013-09-25 NOTE — Telephone Encounter (Signed)
Checked and Dr. Patsy Lager still has not reviewed yet.

## 2013-09-27 NOTE — Telephone Encounter (Signed)
Lab results mailed to home address.

## 2014-04-18 LAB — CBC WITH AUTOMATED DIFF
ABS. BASOPHILS: 0 10*3/uL (ref 0.0–0.2)
ABS. EOSINOPHILS: 0.2 10*3/uL (ref 0.0–0.8)
ABS. IMM. GRANS.: 0 10*3/uL (ref 0.0–0.5)
ABS. LYMPHOCYTES: 3.2 10*3/uL (ref 0.5–4.6)
ABS. MONOCYTES: 0.5 10*3/uL (ref 0.1–1.3)
ABS. NEUTROPHILS: 3 10*3/uL (ref 1.7–8.2)
BASOPHILS: 0 % (ref 0.0–2.0)
EOSINOPHILS: 3 % (ref 0.5–7.8)
HCT: 44.3 % (ref 35.8–46.3)
HGB: 14.1 g/dL (ref 11.7–15.4)
IMMATURE GRANULOCYTES: 0.1 % (ref 0.0–5.0)
LYMPHOCYTES: 47 % — ABNORMAL HIGH (ref 13–44)
MCH: 29 PG (ref 26.1–32.9)
MCHC: 31.8 g/dL (ref 31.4–35.0)
MCV: 91 FL (ref 79.6–97.8)
MONOCYTES: 7 % (ref 4.0–12.0)
MPV: 10.1 FL — ABNORMAL LOW (ref 10.8–14.1)
NEUTROPHILS: 43 % (ref 43–78)
PLATELET: 280 10*3/uL (ref 150–450)
RBC: 4.87 M/uL (ref 4.05–5.25)
RDW: 13.4 % (ref 11.9–14.6)
WBC: 7 10*3/uL (ref 4.3–11.1)

## 2014-04-18 LAB — METABOLIC PANEL, COMPREHENSIVE
A-G Ratio: 1.1 — ABNORMAL LOW (ref 1.2–3.5)
ALT (SGPT): 29 U/L (ref 12–65)
AST (SGOT): 13 U/L — ABNORMAL LOW (ref 15–37)
Albumin: 3.8 g/dL (ref 3.5–5.0)
Alk. phosphatase: 80 U/L (ref 50–136)
Anion gap: 4 mmol/L — ABNORMAL LOW (ref 7–16)
BUN: 14 MG/DL (ref 6–23)
Bilirubin, total: 0.4 MG/DL (ref 0.2–1.1)
CO2: 33 mmol/L — ABNORMAL HIGH (ref 21–32)
Calcium: 9.4 MG/DL (ref 8.3–10.4)
Chloride: 103 mmol/L (ref 98–107)
Creatinine: 0.84 MG/DL (ref 0.6–1.0)
GFR est AA: >60 mL/min/{1.73_m2} (ref >60)
GFR est non-AA: >60 mL/min/{1.73_m2} (ref >60)
Globulin: 3.4 g/dL (ref 2.3–3.5)
Glucose: 118 mg/dL — ABNORMAL HIGH (ref 65–100)
Potassium: 4 mmol/L (ref 3.5–5.1)
Protein, total: 7.2 g/dL (ref 6.3–8.2)
Sodium: 140 mmol/L (ref 136–145)

## 2014-04-18 LAB — LIPASE: Lipase: 465 U/L — ABNORMAL HIGH (ref 73–393)

## 2014-04-18 MED ORDER — SODIUM CHLORIDE 0.9% BOLUS IV
0.9 % | Freq: Once | INTRAVENOUS | Status: AC
Start: 2014-04-18 — End: 2014-04-18
  Administered 2014-04-18: 17:00:00 via INTRAVENOUS

## 2014-04-18 MED ORDER — HYDROMORPHONE (PF) 1 MG/ML IJ SOLN
1 mg/mL | INTRAMUSCULAR | Status: AC
Start: 2014-04-18 — End: 2014-04-18
  Administered 2014-04-18: 17:00:00 via INTRAVENOUS

## 2014-04-18 MED ORDER — PROMETHAZINE 25 MG TAB
25 mg | ORAL_TABLET | Freq: Four times a day (QID) | ORAL | Status: AC | PRN
Start: 2014-04-18 — End: ?

## 2014-04-18 MED ORDER — ONDANSETRON (PF) 4 MG/2 ML INJECTION
4 mg/2 mL | INTRAMUSCULAR | Status: AC
Start: 2014-04-18 — End: 2014-04-18
  Administered 2014-04-18: 17:00:00 via INTRAVENOUS

## 2014-04-18 MED ORDER — DICYCLOMINE 20 MG TAB
20 mg | ORAL_TABLET | Freq: Four times a day (QID) | ORAL | Status: AC
Start: 2014-04-18 — End: 2014-04-23

## 2014-04-18 MED ORDER — OXYCODONE 5 MG TAB
5 mg | ORAL_TABLET | Freq: Four times a day (QID) | ORAL | Status: AC | PRN
Start: 2014-04-18 — End: ?

## 2014-04-18 MED FILL — ONDANSETRON (PF) 4 MG/2 ML INJECTION: 4 mg/2 mL | INTRAMUSCULAR | Qty: 2

## 2014-04-18 MED FILL — HYDROMORPHONE (PF) 1 MG/ML IJ SOLN: 1 mg/mL | INTRAMUSCULAR | Qty: 1

## 2014-04-18 NOTE — ED Notes (Signed)
Pt discharged home with rx x3, instructions to follow up with GI [place] and to follow clear liquid/low fiber diet. Pt verbalized understanding of all instructions and questions answered prior to discharge. All belongings taken with pt. Pt ambulatory out of department w/o difficulty. Accompanied home by family member x1.

## 2014-04-18 NOTE — ED Notes (Signed)
Pt given pain medication. States that she wears CPAP at home. Placed on O2 2L NC after O2 sats dropped to 89%. Pt aware of US order. Family member remains at bedside.

## 2014-04-18 NOTE — ED Notes (Signed)
GI at bedside speaking with patient.

## 2014-04-18 NOTE — ED Notes (Signed)
Pt here with abd pain radiating into back for 3 weeks, [facility] and [facility], told she had pancreatitis but they could not do any thing about it per pt.

## 2014-04-18 NOTE — ED Notes (Signed)
Dr. Leonel RamsayFelder at bedside talking with patient about plan of care. Per Merita NortonAdrianne, RN, VO from Dr. Leonel RamsayFelder to cancel US.

## 2014-04-18 NOTE — Consults (Addendum)
[facility] Consult Note       Referring Physician:  [facility] MD: Dr. Leonel RamsayFelder  Consulting Physician: Erlene SentersIsaac Eldrick Penick, MD    Consult Date:  04/18/2014    Admit Date:  04/18/2014    Chief Complaint:  Epigastric pain    Subjective:     History of Present Illness:  Patient is a 58 y.o. female with PMH listed below who is seen in consultation at the request of Dr. Leonel RamsayFelder for the reason above. Pt reports having had severe constant sharp LUQ abdominal pain that radiates to the epigastric area, worse after meals, alleviated by taking pain medications, & associated with dry heaving, but no vomiting. States that she has [facility] times for same. At one point, she was admitted for 3 days for further evaluation of abdominal pain. Labs & [facility]. Labs on 5/27 were normal except for lipase that was 4x normal. CT scan on 5/26 showed no acute abnormality. U/S on 5/30 showed fatty liver. She saw Dr. Chinita GreenlandVeerabagu, [place] this morning for the ongoing abdominal pain. According to the pt, he had plans on [facility], but pt refused & ended up here in the ER instead. [facility] were normal except for Lipase of 465 (upper normal limit is 393). No fever. Upon further query, eating broccoli & cabbage causes severe abdominal pain with bloating. She gives an alternating & constipation hx. No rectal bleeding or melena. [place], [place], does not recall the name of the endoscopist, but stomach ulcers were found. Rare NSAIDs use. On Prilosec 20mg  every day with good relief of GERD. She had a normal colonoscopy with Dr. Waynette ButteryGreer in 2008. No wt loss.         PMH:  Past Medical History   Diagnosis Date   ??? Hypertension    ??? Other ill-defined conditions(799.89)      enlarged heart.    ??? Gastrointestinal disorder      GERD       PSH:  Past Surgical History   Procedure Laterality Date   ??? Hx gyn       hyst '87,, BTL   ??? Hx orthopaedic        lumbar radiculopathy       Allergies:  Allergies   Allergen Reactions   ??? Neurontin [Gabapentin] Hives       Home Medications:  Prior to Admission medications    Medication Sig Start Date End Date Taking? Authorizing Provider   ALPRAZolam Prudy Feeler(XANAX) 0.5 mg tablet Take  by mouth.   Yes Phys Other, MD   azithromycin (ZITHROMAX) 250 mg tablet Take 250 mg by mouth See Admin Instructions. Take two tablets today then one tablet daily   Yes Phys Other, MD   clonazePAM (KLONOPIN) 1 mg tablet Take 1 mg by mouth two (2) times a day.   Yes Phys Other, MD   cyclobenzaprine (FLEXERIL) 10 mg tablet Take 10 mg by mouth three (3) times daily as needed for Muscle Spasm(s).   Yes Phys Other, MD   roflumilast (DALIRESP) tab tablet Take 500 mcg by mouth daily.   Yes Phys Other, MD   ergocalciferol (ERGOCALCIFEROL) 50,000 unit capsule Take 50,000 Units by mouth.   Yes Phys Other, MD   HYDROcodone-acetaminophen (NORCO) 10-325 mg tablet Take 2 Tabs by mouth every eight (8) hours as needed for Pain.   Yes Phys Other, MD  lisinopril-hydrochlorothiazide (PRINZIDE, ZESTORETIC) 10-12.5 mg per tablet Take 2 Tabs by mouth daily.   Yes Phys Other, MD   montelukast (SINGULAIR) 10 mg tablet Take 10 mg by mouth daily.   Yes Phys Other, MD   mometasone (NASONEX) 50 mcg/actuation nasal spray 2 Sprays daily.   Yes Phys Other, MD   omeprazole (PRILOSEC) 20 mg capsule Take 20 mg by mouth daily.   Yes Phys Other, MD   pravastatin (PRAVACHOL) 10 mg tablet Take 10 mg by mouth nightly.   Yes Phys Other, MD       Hospital Medications:  No current facility-administered medications for this encounter.     Current Outpatient Prescriptions   Medication Sig   ??? ALPRAZolam (XANAX) 0.5 mg tablet Take  by mouth.   ??? azithromycin (ZITHROMAX) 250 mg tablet Take 250 mg by mouth See Admin Instructions. Take two tablets today then one tablet daily   ??? clonazePAM (KLONOPIN) 1 mg tablet Take 1 mg by mouth two (2) times a day.   ??? cyclobenzaprine (FLEXERIL) 10 mg tablet  Take 10 mg by mouth three (3) times daily as needed for Muscle Spasm(s).   ??? roflumilast (DALIRESP) tab tablet Take 500 mcg by mouth daily.   ??? ergocalciferol (ERGOCALCIFEROL) 50,000 unit capsule Take 50,000 Units by mouth.   ??? HYDROcodone-acetaminophen (NORCO) 10-325 mg tablet Take 2 Tabs by mouth every eight (8) hours as needed for Pain.   ??? lisinopril-hydrochlorothiazide (PRINZIDE, ZESTORETIC) 10-12.5 mg per tablet Take 2 Tabs by mouth daily.   ??? montelukast (SINGULAIR) 10 mg tablet Take 10 mg by mouth daily.   ??? mometasone (NASONEX) 50 mcg/actuation nasal spray 2 Sprays daily.   ??? omeprazole (PRILOSEC) 20 mg capsule Take 20 mg by mouth daily.   ??? pravastatin (PRAVACHOL) 10 mg tablet Take 10 mg by mouth nightly.       Social History:  History   Substance Use Topics   ??? Smoking status: Quit smoking many yrs ago   ??? Smokeless tobacco: Never Used   ??? Alcohol Use: No         Family History:  Maternal GF with colon CA    Review of Systems:  A detailed 10 system ROS is obtained, with pertinent positives as listed above.  All others are negative.        Objective:     Physical Exam:  Vitals:  BP 134/72   Pulse 84   Temp(Src) 98.4 ??F (36.9 ??C)   Resp 16   Ht 5\' 7"  (1.702 m)   Wt 121.564 kg (268 lb)   BMI 41.96 kg/m2   SpO2 98%  Gen:  Pt is alert, cooperative, no acute distress  Skin:  Extremities and face reveal no rashes. No palmer erythema. No telangiectasias on the chest wall.  HEENT: Sclerae anicteric.  Extra-occular muscles are intact.  No oral ulcers.  No abnormal pigmentation of the lips.  The neck is supple.  Cardiovascular: Regular rate and rhythm. No murmurs, gallops, or rubs.  Respiratory:  Comfortable breathing with no accessory muscle use. Clear breath sounds anteriorly with no wheezes, rales, or rhonchi.  GI:  Abdomen is OBESE, soft, and MILDLY TTP EPIGASTRIC without G/R.  Normal active bowel sounds. No enlargement of the liver or spleen. No masses palpable.  Rectal:  Deferred  Musculoskeletal:  No pitting  edema of the lower legs.  Extremities have good range of motion.  No costovertebral tenderness.  Neurological:  Gross memory appears intact.  Patient is alert and  oriented.  Psychiatric:  Mood appears appropriate with judgement intact.  Lymphatic:  No cervical or supraclavicular adenopathy.    Laboratory:    Recent Labs      04/18/14   1040   WBC  7.0   HGB  14.1   HCT  44.3   PLT  280   MCV  91.0   NA  140   K  4.0   CL  103   CO2  33*   BUN  14   CREA  0.84   CA  9.4   GLU  118*   AP  80   SGOT  13*   ALT  29   TBILI  0.4   ALB  3.8   TP  7.2   LPSE  465*          Assessment:       Active Problems:  Epigastric pain  Nausea      Plan:       58 y/o female who presents with a 3 wk hx of epigastric pain w/ dry heaving. She has [facility] & admitted once for same. Recent CT scan & U/S were negative. The mildly elevated lipase is of no clinical significance. Given the severe exacerbation of abdominal pain with certain foods, alternating bowel habits, & bloating, suspect  GI sxs are functionally related / IBS. There are no alarm features to suggest any acute worrisome etiology nor warrant hospital admission. Recommend that she f/u with her GI doc, Dr. [place]abagu in [place] for further directive care. Dr. Leonel RamsayFelder will provide Rx for Bentyl & Roxicodone. Advise pt to adhere to FODMAP diet which was provided at the time of ER visit.     Adria DevonEric Carandang PA-C    Patient case was discussed with Dr. Erlene SentersIsaac Georgean Spainhower who agreed with my assessment & plan.    Discussed with PA-C.  This patient left the facility before this MD could see her.  We do not see evidence of pancreatitis based on available testing.  Some features support IBS as the diagnosis.  She will follow up with Dr. Chinita GreenlandVeerabagu.    Bari Edward. Isaac Anis Degidio, MD

## 2014-04-18 NOTE — ED Notes (Signed)
Pt to ED for possible admit for pancreatitis. Had lab work completed at [facility] earlier today. Lipase 243. States pain ongoing for past 3 weeks "and all [facility] did was dope me up." Pt eating normal diet at home. States pain starts in RUQ and radiates to LUQ. Has brought all labs, and imaging results.

## 2014-04-22 NOTE — ED Provider Notes (Addendum)
HPI Comments: Admitted at [facility], a few weeks ago, with a lipase value of ~245, which was 4x teh upper limit of normal for their lab (60), she had nl ct and US,  Pt persists with epigastric pains, worse after eating (cabbage, last night, in particular),  Pain unrelieved with hydrocodone +n/v    Ironically, pt went to [place] GI office [place] for [place] ,and they took one look at her, seeing how much pain she was in , and they said to go straight to the e.r.  She came here, instead, disenchanted with [facility]    Patient is a 58 y.o. female presenting with abdominal pain. The history is provided by the patient.   Abdominal Pain   This is a new problem. The current episode started more than 1 week ago. The problem occurs constantly. Associated symptoms include nausea and vomiting. Pertinent negatives include no fever, no diarrhea, no headaches, no arthralgias, no chest pain and no back pain.        Past Medical History   Diagnosis Date   ??? Hypertension    ??? Other ill-defined conditions(799.89)      enlarged heart.    ??? Gastrointestinal disorder      GERD        Past Surgical History   Procedure Laterality Date   ??? Hx gyn       hyst '87,, BTL   ??? Hx orthopaedic       lumbar radiculopathy         History reviewed. No pertinent family history.     History     Social History   ??? Marital Status: DIVORCED     Spouse Name: N/A     Number of Children: N/A   ??? Years of Education: N/A     Occupational History   ??? Not on file.     Social History Main Topics   ??? Smoking status: Never Smoker    ??? Smokeless tobacco: Never Used   ??? Alcohol Use: No   ??? Drug Use: Not on file   ??? Sexual Activity: Not on file     Other Topics Concern   ??? Not on file     Social History Narrative                  ALLERGIES: Neurontin      Review of Systems   Constitutional: Negative for fever and chills.   HENT: Negative for rhinorrhea and sore throat.    Eyes: Negative for discharge and redness.   Respiratory: Negative for cough  and shortness of breath.    Cardiovascular: Negative for chest pain and palpitations.   Gastrointestinal: Positive for nausea, vomiting and abdominal pain. Negative for diarrhea.   Genitourinary: Negative for flank pain and difficulty urinating.   Musculoskeletal: Negative for back pain and arthralgias.   Skin: Negative for pallor and rash.   Neurological: Negative for dizziness and headaches.   All other systems reviewed and are negative.      Filed Vitals:    04/18/14 1324 04/18/14 1344 04/18/14 1359 04/18/14 1447   BP: 144/86 134/72  118/70   Pulse:    61   Temp:       Resp:   16 16   Height:       Weight:       SpO2: 97% 98% 98% 96%            Physical Exam  Constitutional: She is oriented to person, place, and time. She appears well-developed and well-nourished. She appears distressed.   HENT:   Head: Normocephalic and atraumatic.   Eyes: Conjunctivae are normal. Pupils are equal, round, and reactive to light. Right eye exhibits no discharge. Left eye exhibits no discharge. No scleral icterus.   Neck: Normal range of motion. Neck supple.   Cardiovascular: Normal rate, regular rhythm and normal heart sounds.  Exam reveals no gallop.    No murmur heard.  Pulmonary/Chest: Effort normal and breath sounds normal. No respiratory distress. She has no wheezes. She has no rales.   Abdominal: Soft. Bowel sounds are normal. There is tenderness in the epigastric area. There is no guarding.   Musculoskeletal: Normal range of motion. She exhibits no edema.   Neurological: She is alert and oriented to person, place, and time. She exhibits normal muscle tone.   cni 2-12 grossly   Skin: Skin is warm and dry. She is not diaphoretic.   Psychiatric: She has a normal mood and affect. Her behavior is normal.   Nursing note and vitals reviewed.       MDM    Procedures    D/w dr Floyce Stakesgaines from gi, and their PA who came and saw the patient

## 2014-09-04 ENCOUNTER — Ambulatory Visit (INDEPENDENT_AMBULATORY_CARE_PROVIDER_SITE_OTHER): Payer: BC Managed Care – PPO | Admitting: Family Medicine

## 2014-09-04 VITALS — BP 118/80 | HR 69 | Temp 98.0°F | Resp 16 | Ht 63.0 in | Wt 168.0 lb

## 2014-09-04 DIAGNOSIS — M545 Low back pain, unspecified: Secondary | ICD-10-CM

## 2014-09-04 LAB — POCT UA - MICROSCOPIC ONLY
Casts, Ur, LPF, POC: NEGATIVE
Crystals, Ur, HPF, POC: NEGATIVE
Mucus, UA: NEGATIVE
Yeast, UA: NEGATIVE

## 2014-09-04 LAB — POCT URINALYSIS DIPSTICK
Bilirubin, UA: NEGATIVE
GLUCOSE UA: NEGATIVE
Ketones, UA: NEGATIVE
Nitrite, UA: NEGATIVE
Protein, UA: NEGATIVE
Spec Grav, UA: <=1.005
UROBILINOGEN UA: 0.2
pH, UA: 5

## 2014-09-04 MED ORDER — DICLOFENAC SODIUM 75 MG PO TBEC
75.0000 mg | DELAYED_RELEASE_TABLET | Freq: Two times a day (BID) | ORAL | Status: DC
Start: 2014-09-04 — End: 2014-12-11

## 2014-09-04 MED ORDER — HYDROCODONE-ACETAMINOPHEN 5-325 MG PO TABS: 1.0000 | ORAL_TABLET | Freq: Four times a day (QID) | ORAL | Status: DC | PRN

## 2014-09-04 MED ORDER — HYOSCYAMINE SULFATE 0.125 MG SL SUBL: 0.1250 mg | SUBLINGUAL_TABLET | SUBLINGUAL | Status: DC | PRN

## 2014-09-04 MED ORDER — PREDNISONE 20 MG PO TABS: 40.0000 mg | ORAL_TABLET | Freq: Every day | ORAL | Status: DC

## 2014-09-04 MED ORDER — CLOTRIMAZOLE-BETAMETHASONE 1-0.05 % EX CREA: 1.0000 "application " | TOPICAL_CREAM | Freq: Two times a day (BID) | CUTANEOUS | Status: DC

## 2014-09-04 NOTE — Patient Instructions (Signed)

## 2014-09-04 NOTE — Progress Notes (Signed)
° °  Subjective:    Patient ID: Cassandra Hicks, female    DOB: September 28, 1956, 58 y.o.   MRN: [id number] This chart was scribed for Robyn Haber, MD by Marti Sleigh, Medical Scribe. This patient was seen in Room 1 and the patient's care was started at 11:16 AM.  HPI HPI Comments: Cassandra Hicks is a 58 y.o. female with a hx of HTN who presents to the Emergency Department complaining of back pain that started one 1.5 weeks ago. Pt states she has associated radiating left leg pain. Pt states her symptoms are worse with standing for long periods. Pt endorses increased physical labor. Pt has taken ibuprofen with some relief of symptoms. Pt states she has not had a UTI in many years. Pt states she has been out of work due to her back pain. She states she has been laying on the couch for hours. Pt states she has been crying in the afternoon due to pain.  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  Review of Systems  Constitutional: Negative for fever and chills.  Gastrointestinal: Negative for nausea and vomiting.  Genitourinary: Negative for dysuria and difficulty urinating.  Musculoskeletal: Positive for back pain.       Objective:   Physical Exam  Nursing note and vitals reviewed. Constitutional: She is oriented to person, place, and time. She appears well-developed and well-nourished.  Pt is walking with care.  HENT:  Head: Normocephalic and atraumatic.  Eyes: Pupils are equal, round, and reactive to light.  Neck: No JVD present.  Cardiovascular: Normal rate and regular rhythm.   Pulmonary/Chest: Effort normal and breath sounds normal. No respiratory distress.  Abdominal: Soft. There is no tenderness.  Musculoskeletal: She exhibits tenderness.  Mild pain with left femoral stretch test. Mild positive straight leg raise on left.  Neurological: She is alert and oriented to person, place, and time. She has normal reflexes.  Skin: Skin is warm and dry.  Psychiatric: She has a normal mood and affect.  Her behavior is normal.       Assessment & Plan:   Patient will start with the Voltaren. If this does not work or the pain gets worse she'll moved to the prednisone. She is instructed to call me for significantly worsening pain or changing symptoms.  Signed,

## 2014-09-05 LAB — URINE CULTURE: Colony Count: >=100000

## 2014-09-06 ENCOUNTER — Telehealth: Payer: Self-pay | Admitting: Family Medicine

## 2014-09-06 MED ORDER — CIPROFLOXACIN HCL 500 MG PO TABS: 500.0000 mg | ORAL_TABLET | Freq: Two times a day (BID) | ORAL | Status: DC

## 2014-09-06 NOTE — Addendum Note (Signed)
Addended by: Robyn Haber on: 09/06/2014 03:34 PM   Modules accepted: Orders

## 2014-09-06 NOTE — Telephone Encounter (Signed)
Patient called and states that a clinical staff member called her and explained that the wrong medication. Patient also states that she believes she was misdiagnosed. Please advise.   [phone]

## 2014-09-06 NOTE — Telephone Encounter (Signed)
Spoke with patient regarding her concerns.  Informed patient that her presenting symptoms on the date of her visit mimicked a musculoskeletal issue; therefore, that is the treatment plan Dr. Joseph Art followed.  I explained to her that a urine culture was sent to an outside lab for further testing.  Culture and sensitivity revealed a UTI; hence, Dr. Joseph Art ordered antibiotic therapy.  Patient verbalized understanding.  She stated she would call back if symptoms did not improve or if they worsened.

## 2014-12-02 ENCOUNTER — Other Ambulatory Visit (HOSPITAL_COMMUNITY): Payer: Self-pay

## 2014-12-02 NOTE — Patient Instructions (Addendum)
   Your procedure is scheduled on: Monday, Feb 1  Enter through the [employer] [facility] at: 6 AM Pick up the phone at the desk and dial [phone] and inform us of your arrival.  Please call this number if you have any problems the morning of surgery: [phone]  Remember: Do not eat or drink after midnight: Sunday Take these medicines the morning of surgery with a SIP OF WATER: metoprolol and hctz  Do not wear jewelry, make-up, or FINGER nail polish No metal in your hair or on your body. Do not wear lotions, powders, perfumes.  You may wear deodorant.  Do not bring valuables to the hospital. Contacts, dentures or bridgework may not be worn into surgery.  Leave suitcase in the car. After Surgery it may be brought to your room. For patients being admitted to the hospital, checkout time is 11:00am the day of discharge.  Home with husband Joe cell [phone].

## 2014-12-03 ENCOUNTER — Other Ambulatory Visit: Payer: Self-pay

## 2014-12-03 ENCOUNTER — Encounter (HOSPITAL_COMMUNITY): Payer: Self-pay

## 2014-12-03 ENCOUNTER — Encounter (HOSPITAL_COMMUNITY)
Admission: RE | Admit: 2014-12-03 | Discharge: 2014-12-03 | Disposition: A | Discharge status: Home / Self Care | Payer: BC Managed Care – PPO | Source: Ambulatory Visit | Attending: Obstetrics and Gynecology | Admitting: Obstetrics and Gynecology

## 2014-12-03 DIAGNOSIS — N8320 Unspecified ovarian cysts: Secondary | ICD-10-CM | POA: Diagnosis not present

## 2014-12-03 DIAGNOSIS — Z01818 Encounter for other preprocedural examination: Secondary | ICD-10-CM | POA: Insufficient documentation

## 2014-12-03 DIAGNOSIS — D259 Leiomyoma of uterus, unspecified: Secondary | ICD-10-CM | POA: Diagnosis not present

## 2014-12-03 HISTORY — DX: Missed abortion: O02.1

## 2014-12-03 HISTORY — DX: Gastro-esophageal reflux disease without esophagitis: K21.9

## 2014-12-03 HISTORY — DX: Essential (primary) hypertension: I10

## 2014-12-03 HISTORY — DX: Anxiety disorder, unspecified: F41.9

## 2014-12-03 LAB — BASIC METABOLIC PANEL
ANION GAP: 7 (ref 5–15)
BUN: 17 mg/dL (ref 6–23)
CHLORIDE: 105 mmol/L (ref 96–112)
CO2: 28 mmol/L (ref 19–32)
Calcium: 9.6 mg/dL (ref 8.4–10.5)
Creatinine, Ser: 0.8 mg/dL (ref 0.50–1.10)
GFR calc non Af Amer: 80 mL/min — ABNORMAL LOW (ref >90)
Glucose, Bld: 97 mg/dL (ref 70–99)
POTASSIUM: 4 mmol/L (ref 3.5–5.1)
SODIUM: 140 mmol/L (ref 135–145)

## 2014-12-03 LAB — CBC
HCT: 42 % (ref 36.0–46.0)
Hemoglobin: 14.5 g/dL (ref 12.0–15.0)
MCH: 31.9 pg (ref 26.0–34.0)
MCHC: 34.5 g/dL (ref 30.0–36.0)
MCV: 92.5 fL (ref 78.0–100.0)
PLATELETS: 243 10*3/uL (ref 150–400)
RBC: 4.54 MIL/uL (ref 3.87–5.11)
RDW: 12.7 % (ref 11.5–15.5)
WBC: 8 10*3/uL (ref 4.0–10.5)

## 2014-12-04 NOTE — H&P (Signed)
Cassandra Hicks  DICTATION # [id number] CSN# [id number]   Margarette Asal, MD 12/04/2014 8:20 AM

## 2014-12-05 NOTE — H&P (Signed)
Cassandra Hicks, Cassandra Hicks                  ACCOUNT NO.:  [id number]  MEDICAL RECORD NO.:  [id number]  LOCATION:                                 FACILITY:  PHYSICIAN:  Ralene Bathe. Matthew Saras, M.D.DATE OF BIRTH:  14-Sep-1956  DATE OF ADMISSION: DATE OF DISCHARGE:                             HISTORY & PHYSICAL   CHIEF COMPLAINT:  Pelvic pain, ovarian cyst.  HISTORY OF PRESENT ILLNESS:  A 59 year old, G5, P2 prior tubal ligation, who has been seen for her annual exam recently complaining of some left pelvic and back pain.  Ultrasound was scheduled and performed October 29, 2014, left-sided ovarian cyst 9.8 x 6.5 and no solid areas.  Her CA- 125 was checked and returned normal at 514.  It was noted at the time of tubal in 1997, that she had some omental and bowel adhesions to the anterior abdominal wall, but tubal could still be carried out.  At that time, she was also noted to have a small anterior wall fibroid.  She presents now for TAH/BSO.  This procedure including specific risks related to bleeding, infection, transfusion, adjacent organ injury, wound infection, phlebitis, the need for ERT along with her expected recovery time discussed which she understands and accepts.  PAST MEDICAL HISTORY:  ALLERGIES:  PENICILLIN.  SURGICAL HISTORY:  Appendectomy in 1971, 3 prior D and Cs, 2 ectopic pregnancies and 2 prior laparoscopic exams including tubal.  CURRENT MEDICINES:  Metoprolol and hydrochlorothiazide.  REVIEW OF SYSTEMS:  Significant for history of UTI and hypertension.  FAMILY HISTORY:  Significant for asthma, epilepsy, thyroid disease, osteoporosis, diverticulosis, hypertension, and lung cancer.  SOCIAL HISTORY:  Denies tobacco or drug use.  She is a social drinker. She is married.  Does not currently have a regular PCP.  PHYSICAL EXAMINATION:  VITAL SIGNS:  Temp 98.2, blood pressure 138/100. HEENT:  Unremarkable. NECK:  Supple without masses. LUNGS:  Clear. CARDIOVASCULAR:   Regular rate and rhythm without murmurs, rubs, gallops noted. BREASTS:  Without masses. ABDOMEN:  Soft, flat, nontender.  Vulva, vagina and cervix normal. Uterus mid position, normal size, slightly full on the left.  No definite mass noted.  No other nodularity.  IMPRESSION:  Ovarian cyst, acute and chronic pelvic pain.  PLAN:  TAH/BSO.  Procedure and risks reviewed as above.     Kaia Depaolis M. Matthew Saras, M.D.     [facility]/MEDQ  D:  12/04/2014  T:  12/04/2014  Job:  [id number]

## 2014-12-08 MED ORDER — GENTAMICIN SULFATE 40 MG/ML IJ SOLN
INTRAVENOUS | Status: AC
  Administered 2014-12-09: 113.75 mL via INTRAVENOUS
  Filled 2014-12-08: qty 7.75

## 2014-12-09 ENCOUNTER — Encounter (HOSPITAL_COMMUNITY): Payer: Self-pay

## 2014-12-09 ENCOUNTER — Inpatient Hospital Stay (HOSPITAL_COMMUNITY): Payer: BC Managed Care – PPO | Admitting: Anesthesiology

## 2014-12-09 ENCOUNTER — Encounter (HOSPITAL_COMMUNITY)
Admission: RE | Disposition: A | Discharge status: Home / Self Care | Payer: Self-pay | Source: Ambulatory Visit | Attending: Obstetrics and Gynecology

## 2014-12-09 ENCOUNTER — Inpatient Hospital Stay (HOSPITAL_COMMUNITY)
Admission: RE | Admit: 2014-12-09 | Discharge: 2014-12-11 | DRG: 743 | Disposition: A | Discharge status: Home / Self Care | Payer: BC Managed Care – PPO | Source: Ambulatory Visit | Attending: Obstetrics and Gynecology | Admitting: Obstetrics and Gynecology

## 2014-12-09 DIAGNOSIS — Z880 Allergy status to penicillin: Secondary | ICD-10-CM

## 2014-12-09 DIAGNOSIS — Z87440 Personal history of urinary (tract) infections: Secondary | ICD-10-CM

## 2014-12-09 DIAGNOSIS — Z79899 Other long term (current) drug therapy: Secondary | ICD-10-CM | POA: Diagnosis not present

## 2014-12-09 DIAGNOSIS — N83209 Unspecified ovarian cyst, unspecified side: Secondary | ICD-10-CM | POA: Diagnosis present

## 2014-12-09 DIAGNOSIS — N8320 Unspecified ovarian cysts: Principal | ICD-10-CM | POA: Diagnosis present

## 2014-12-09 DIAGNOSIS — R102 Pelvic and perineal pain: Secondary | ICD-10-CM | POA: Diagnosis present

## 2014-12-09 DIAGNOSIS — I10 Essential (primary) hypertension: Secondary | ICD-10-CM | POA: Diagnosis present

## 2014-12-09 HISTORY — PX: SALPINGOOPHORECTOMY: SHX82

## 2014-12-09 HISTORY — PX: ABDOMINAL HYSTERECTOMY: SHX81

## 2014-12-09 SURGERY — HYSTERECTOMY, ABDOMINAL
Anesthesia: General

## 2014-12-09 MED ORDER — SODIUM CHLORIDE 0.9 % IJ SOLN
9.0000 mL | INTRAMUSCULAR | Status: DC | PRN
Start: 2014-12-09 — End: 2014-12-09

## 2014-12-09 MED ORDER — PROPOFOL 10 MG/ML IV BOLUS
INTRAVENOUS | Status: DC | PRN
  Administered 2014-12-09: 150 mg via INTRAVENOUS

## 2014-12-09 MED ORDER — KETOROLAC TROMETHAMINE 30 MG/ML IJ SOLN
INTRAMUSCULAR | Status: DC | PRN
  Administered 2014-12-09: 30 mg via INTRAVENOUS

## 2014-12-09 MED ORDER — FLUOXETINE HCL 20 MG PO TABS: 20.0000 mg | ORAL_TABLET | Freq: Every day | ORAL | Status: DC

## 2014-12-09 MED ORDER — PROPOFOL 10 MG/ML IV BOLUS
INTRAVENOUS | Status: AC
  Filled 2014-12-09: qty 20

## 2014-12-09 MED ORDER — PROMETHAZINE HCL 25 MG/ML IJ SOLN
6.2500 mg | INTRAMUSCULAR | Status: DC | PRN
  Administered 2014-12-09: 6.25 mg via INTRAVENOUS

## 2014-12-09 MED ORDER — LIDOCAINE HCL (CARDIAC) 20 MG/ML IV SOLN
INTRAVENOUS | Status: AC
  Filled 2014-12-09: qty 5

## 2014-12-09 MED ORDER — SODIUM CHLORIDE 0.9 % IJ SOLN
INTRAMUSCULAR | Status: DC | PRN
Start: 2014-12-09 — End: 2014-12-09
  Administered 2014-12-09: 20 mL via INTRAVENOUS

## 2014-12-09 MED ORDER — OXYCODONE-ACETAMINOPHEN 5-325 MG PO TABS
1.0000 | ORAL_TABLET | ORAL | Status: DC | PRN
  Administered 2014-12-10: 1 via ORAL
  Filled 2014-12-09: qty 1

## 2014-12-09 MED ORDER — DIPHENHYDRAMINE HCL 50 MG/ML IJ SOLN: 12.5000 mg | Freq: Four times a day (QID) | INTRAMUSCULAR | Status: DC | PRN

## 2014-12-09 MED ORDER — NEOSTIGMINE METHYLSULFATE 10 MG/10ML IV SOLN
INTRAVENOUS | Status: AC
  Filled 2014-12-09: qty 1

## 2014-12-09 MED ORDER — ACETAMINOPHEN 160 MG/5ML PO SOLN: 325.0000 mg | ORAL | Status: DC | PRN

## 2014-12-09 MED ORDER — IBUPROFEN 800 MG PO TABS
800.0000 mg | ORAL_TABLET | Freq: Three times a day (TID) | ORAL | Status: DC | PRN
  Administered 2014-12-10 – 2014-12-11 (×3): 800 mg via ORAL
  Filled 2014-12-09 (×3): qty 1

## 2014-12-09 MED ORDER — SODIUM CHLORIDE 0.9 % IJ SOLN: 9.0000 mL | INTRAMUSCULAR | Status: DC | PRN

## 2014-12-09 MED ORDER — FENTANYL CITRATE 0.05 MG/ML IJ SOLN
INTRAMUSCULAR | Status: AC
  Filled 2014-12-09: qty 2

## 2014-12-09 MED ORDER — BUPIVACAINE LIPOSOME 1.3 % IJ SUSP
20.0000 mL | Freq: Once | INTRAMUSCULAR | Status: DC
  Filled 2014-12-09: qty 20

## 2014-12-09 MED ORDER — FENTANYL CITRATE 0.05 MG/ML IJ SOLN
INTRAMUSCULAR | Status: DC | PRN
  Administered 2014-12-09 (×2): 100 ug via INTRAVENOUS
  Administered 2014-12-09: 50 ug via INTRAVENOUS
  Administered 2014-12-09: 100 ug via INTRAVENOUS

## 2014-12-09 MED ORDER — ROCURONIUM BROMIDE 100 MG/10ML IV SOLN
INTRAVENOUS | Status: AC
  Filled 2014-12-09: qty 1

## 2014-12-09 MED ORDER — PROMETHAZINE HCL 25 MG/ML IJ SOLN
INTRAMUSCULAR | Status: AC
  Administered 2014-12-09: 6.25 mg via INTRAVENOUS
  Filled 2014-12-09: qty 1

## 2014-12-09 MED ORDER — HYDROCHLOROTHIAZIDE 25 MG PO TABS
25.0000 mg | ORAL_TABLET | Freq: Every day | ORAL | Status: DC
  Administered 2014-12-10 – 2014-12-11 (×2): 25 mg via ORAL
  Filled 2014-12-09 (×2): qty 1

## 2014-12-09 MED ORDER — ONDANSETRON HCL 4 MG PO TABS: 4.0000 mg | ORAL_TABLET | Freq: Four times a day (QID) | ORAL | Status: DC | PRN

## 2014-12-09 MED ORDER — SCOPOLAMINE 1 MG/3DAYS TD PT72
MEDICATED_PATCH | TRANSDERMAL | Status: AC
  Administered 2014-12-09: 1.5 mg via TRANSDERMAL
  Filled 2014-12-09: qty 1

## 2014-12-09 MED ORDER — MORPHINE SULFATE (PF) 1 MG/ML IV SOLN
INTRAVENOUS | Status: DC
  Administered 2014-12-09: 14:00:00 via INTRAVENOUS
  Administered 2014-12-09: 13.5 mg via INTRAVENOUS
  Administered 2014-12-10: 15 mg via INTRAVENOUS
  Administered 2014-12-10: 3 mg via INTRAVENOUS
  Administered 2014-12-10: 6 mL via INTRAVENOUS

## 2014-12-09 MED ORDER — MEPERIDINE HCL 25 MG/ML IJ SOLN: 6.2500 mg | INTRAMUSCULAR | Status: DC | PRN

## 2014-12-09 MED ORDER — KETOROLAC TROMETHAMINE 30 MG/ML IJ SOLN: 30.0000 mg | Freq: Once | INTRAMUSCULAR | Status: DC

## 2014-12-09 MED ORDER — GLYCOPYRROLATE 0.2 MG/ML IJ SOLN
INTRAMUSCULAR | Status: DC | PRN
  Administered 2014-12-09: 0.6 mg via INTRAVENOUS

## 2014-12-09 MED ORDER — BUTORPHANOL TARTRATE 1 MG/ML IJ SOLN: 1.0000 mg | INTRAMUSCULAR | Status: DC | PRN

## 2014-12-09 MED ORDER — MIDAZOLAM HCL 2 MG/2ML IJ SOLN
INTRAMUSCULAR | Status: AC
  Filled 2014-12-09: qty 2

## 2014-12-09 MED ORDER — ROCURONIUM BROMIDE 100 MG/10ML IV SOLN
INTRAVENOUS | Status: DC | PRN
Start: 2014-12-09 — End: 2014-12-09
  Administered 2014-12-09: 20 mg via INTRAVENOUS
  Administered 2014-12-09: 30 mg via INTRAVENOUS

## 2014-12-09 MED ORDER — ACETAMINOPHEN 325 MG PO TABS: 325.0000 mg | ORAL_TABLET | ORAL | Status: DC | PRN

## 2014-12-09 MED ORDER — ONDANSETRON HCL 4 MG/2ML IJ SOLN
INTRAMUSCULAR | Status: DC | PRN
Start: 2014-12-09 — End: 2014-12-09
  Administered 2014-12-09: 4 mg via INTRAVENOUS

## 2014-12-09 MED ORDER — LIDOCAINE HCL (CARDIAC) 20 MG/ML IV SOLN
INTRAVENOUS | Status: DC | PRN
  Administered 2014-12-09: 100 mg via INTRAVENOUS

## 2014-12-09 MED ORDER — KETOROLAC TROMETHAMINE 30 MG/ML IJ SOLN
INTRAMUSCULAR | Status: AC
  Filled 2014-12-09: qty 1

## 2014-12-09 MED ORDER — MORPHINE SULFATE (PF) 1 MG/ML IV SOLN
INTRAVENOUS | Status: DC
  Administered 2014-12-09: 11:00:00 via INTRAVENOUS
  Administered 2014-12-09: 6 mg via INTRAVENOUS
  Filled 2014-12-09: qty 25

## 2014-12-09 MED ORDER — KETOROLAC TROMETHAMINE 30 MG/ML IJ SOLN: 30.0000 mg | Freq: Four times a day (QID) | INTRAMUSCULAR | Status: DC

## 2014-12-09 MED ORDER — FLUTICASONE PROPIONATE 50 MCG/ACT NA SUSP: 1.0000 | Freq: Every day | NASAL | Status: DC

## 2014-12-09 MED ORDER — KETOROLAC TROMETHAMINE 30 MG/ML IJ SOLN: 30.0000 mg | Freq: Once | INTRAMUSCULAR | Status: DC | PRN

## 2014-12-09 MED ORDER — MENTHOL 3 MG MT LOZG: 1.0000 | LOZENGE | OROMUCOSAL | Status: DC | PRN

## 2014-12-09 MED ORDER — ONDANSETRON HCL 4 MG/2ML IJ SOLN
INTRAMUSCULAR | Status: AC
  Filled 2014-12-09: qty 2

## 2014-12-09 MED ORDER — METOPROLOL SUCCINATE ER 50 MG PO TB24
50.0000 mg | ORAL_TABLET | Freq: Every day | ORAL | Status: DC
  Administered 2014-12-10 – 2014-12-11 (×2): 50 mg via ORAL
  Filled 2014-12-09 (×2): qty 1

## 2014-12-09 MED ORDER — DIPHENHYDRAMINE HCL 12.5 MG/5ML PO ELIX: 12.5000 mg | ORAL_SOLUTION | Freq: Four times a day (QID) | ORAL | Status: DC | PRN

## 2014-12-09 MED ORDER — MIDAZOLAM HCL 2 MG/2ML IJ SOLN: 0.5000 mg | Freq: Once | INTRAMUSCULAR | Status: DC | PRN

## 2014-12-09 MED ORDER — HEPARIN SODIUM (PORCINE) 5000 UNIT/ML IJ SOLN
INTRAMUSCULAR | Status: DC | PRN
  Administered 2014-12-09: 5000 [IU] via SUBCUTANEOUS

## 2014-12-09 MED ORDER — SODIUM CHLORIDE 0.9 % IJ SOLN
INTRAMUSCULAR | Status: AC
  Filled 2014-12-09: qty 20

## 2014-12-09 MED ORDER — ONDANSETRON HCL 4 MG/2ML IJ SOLN: 4.0000 mg | Freq: Four times a day (QID) | INTRAMUSCULAR | Status: DC | PRN

## 2014-12-09 MED ORDER — HYDROMORPHONE HCL 1 MG/ML IJ SOLN
INTRAMUSCULAR | Status: AC
  Administered 2014-12-09: 0.5 mg via INTRAVENOUS
  Filled 2014-12-09: qty 1

## 2014-12-09 MED ORDER — DIPHENHYDRAMINE HCL 12.5 MG/5ML PO ELIX
12.5000 mg | ORAL_SOLUTION | Freq: Four times a day (QID) | ORAL | Status: DC | PRN
Start: 2014-12-09 — End: 2014-12-09

## 2014-12-09 MED ORDER — DEXTROSE IN LACTATED RINGERS 5 % IV SOLN
INTRAVENOUS | Status: DC
  Administered 2014-12-09 – 2014-12-10 (×2): via INTRAVENOUS

## 2014-12-09 MED ORDER — KETOROLAC TROMETHAMINE 30 MG/ML IJ SOLN
30.0000 mg | Freq: Four times a day (QID) | INTRAMUSCULAR | Status: DC
  Administered 2014-12-09 – 2014-12-10 (×2): 30 mg via INTRAVENOUS
  Filled 2014-12-09 (×2): qty 1

## 2014-12-09 MED ORDER — SCOPOLAMINE 1 MG/3DAYS TD PT72
1.0000 | MEDICATED_PATCH | Freq: Once | TRANSDERMAL | Status: DC
  Administered 2014-12-09: 1.5 mg via TRANSDERMAL

## 2014-12-09 MED ORDER — NALOXONE HCL 0.4 MG/ML IJ SOLN: 0.4000 mg | INTRAMUSCULAR | Status: DC | PRN

## 2014-12-09 MED ORDER — DEXAMETHASONE SODIUM PHOSPHATE 10 MG/ML IJ SOLN
INTRAMUSCULAR | Status: DC | PRN
  Administered 2014-12-09: 10 mg via INTRAVENOUS

## 2014-12-09 MED ORDER — NEOSTIGMINE METHYLSULFATE 10 MG/10ML IV SOLN
INTRAVENOUS | Status: DC | PRN
  Administered 2014-12-09: 4 mg via INTRAVENOUS

## 2014-12-09 MED ORDER — LACTATED RINGERS IV SOLN
INTRAVENOUS | Status: DC
  Administered 2014-12-09 (×3): via INTRAVENOUS

## 2014-12-09 MED ORDER — BUPIVACAINE LIPOSOME 1.3 % IJ SUSP
INTRAMUSCULAR | Status: DC | PRN
  Administered 2014-12-09: 20 mL

## 2014-12-09 MED ORDER — DEXAMETHASONE SODIUM PHOSPHATE 10 MG/ML IJ SOLN
INTRAMUSCULAR | Status: AC
  Filled 2014-12-09: qty 1

## 2014-12-09 MED ORDER — HEPARIN SODIUM (PORCINE) 5000 UNIT/ML IJ SOLN
INTRAMUSCULAR | Status: AC
  Filled 2014-12-09: qty 1

## 2014-12-09 MED ORDER — FENTANYL CITRATE 0.05 MG/ML IJ SOLN
INTRAMUSCULAR | Status: AC
  Filled 2014-12-09: qty 5

## 2014-12-09 MED ORDER — HYDROMORPHONE HCL 1 MG/ML IJ SOLN
0.2500 mg | INTRAMUSCULAR | Status: DC | PRN
  Administered 2014-12-09 (×2): 0.5 mg via INTRAVENOUS

## 2014-12-09 MED ORDER — GLYCOPYRROLATE 0.2 MG/ML IJ SOLN
INTRAMUSCULAR | Status: AC
  Filled 2014-12-09: qty 3

## 2014-12-09 MED ORDER — MIDAZOLAM HCL 2 MG/2ML IJ SOLN
INTRAMUSCULAR | Status: DC | PRN
  Administered 2014-12-09: 2 mg via INTRAVENOUS

## 2014-12-09 SURGICAL SUPPLY — 45 items
BARRIER ADHS 3X4 INTERCEED (GAUZE/BANDAGES/DRESSINGS) IMPLANT
BRR ADH 4X3 ABS CNTRL BYND (GAUZE/BANDAGES/DRESSINGS)
CANISTER SUCT 3000ML (MISCELLANEOUS) ×4 IMPLANT
CLOSURE WOUND 1/4X4 (GAUZE/BANDAGES/DRESSINGS)
CLOTH BEACON ORANGE TIMEOUT ST (SAFETY) ×4 IMPLANT
CONT PATH 16OZ SNAP LID 3702 (MISCELLANEOUS) ×4 IMPLANT
DECANTER SPIKE VIAL GLASS SM (MISCELLANEOUS) IMPLANT
DRAPE WARM FLUID 44X44 (DRAPE) ×4 IMPLANT
DRSG OPSITE POSTOP 4X10 (GAUZE/BANDAGES/DRESSINGS) ×4 IMPLANT
DURAPREP 26ML APPLICATOR (WOUND CARE) ×4 IMPLANT
ELECT LIGASURE SHORT 9 REUSE (ELECTRODE) ×2 IMPLANT
GLOVE BIO SURGEON STRL SZ7 (GLOVE) ×8 IMPLANT
GLOVE BIOGEL PI IND STRL 7.0 (GLOVE) ×4 IMPLANT
GLOVE BIOGEL PI INDICATOR 7.0 (GLOVE) ×4
GOWN STRL REUS W/TWL LRG LVL3 (GOWN DISPOSABLE) ×14 IMPLANT
NDL HYPO 18GX1.5 BLUNT FILL (NEEDLE) IMPLANT
NEEDLE HYPO 18GX1.5 BLUNT FILL (NEEDLE) ×4 IMPLANT
NEEDLE HYPO 22GX1.5 SAFETY (NEEDLE) ×4 IMPLANT
NS IRRIG 1000ML POUR BTL (IV SOLUTION) ×6 IMPLANT
PACK ABDOMINAL GYN (CUSTOM PROCEDURE TRAY) ×4 IMPLANT
PAD ABD 7.5X8 STRL (GAUZE/BANDAGES/DRESSINGS) ×2 IMPLANT
PAD OB MATERNITY 4.3X12.25 (PERSONAL CARE ITEMS) ×4 IMPLANT
PROTECTOR NERVE ULNAR (MISCELLANEOUS) ×4 IMPLANT
RETRACTOR WND ALEXIS 25 LRG (MISCELLANEOUS) IMPLANT
RTRCTR WOUND ALEXIS 25CM LRG (MISCELLANEOUS) ×4
SPONGE LAP 18X18 X RAY DECT (DISPOSABLE) ×8 IMPLANT
STRIP CLOSURE SKIN 1/4X4 (GAUZE/BANDAGES/DRESSINGS) IMPLANT
SUT CHROMIC 3 0 SH 27 (SUTURE) IMPLANT
SUT MON AB 2-0 CT1 36 (SUTURE) ×4 IMPLANT
SUT MON AB 4-0 PS1 27 (SUTURE) ×4 IMPLANT
SUT PDS AB 0 CT1 27 (SUTURE) ×8 IMPLANT
SUT PLAIN 2 0 XLH (SUTURE) ×2 IMPLANT
SUT VIC AB 0 CT1 18XCR BRD8 (SUTURE) ×4 IMPLANT
SUT VIC AB 0 CT1 27 (SUTURE) ×4
SUT VIC AB 0 CT1 27XBRD ANBCTR (SUTURE) ×2 IMPLANT
SUT VIC AB 0 CT1 8-18 (SUTURE) ×8
SUT VIC AB 2-0 CT1 27 (SUTURE)
SUT VIC AB 2-0 CT1 TAPERPNT 27 (SUTURE) IMPLANT
SUT VIC AB 3-0 CT1 27 (SUTURE) ×4
SUT VIC AB 3-0 CT1 TAPERPNT 27 (SUTURE) ×2 IMPLANT
SUT VICRYL 0 TIES 12 18 (SUTURE) ×4 IMPLANT
SYR 20CC LL (SYRINGE) ×8 IMPLANT
TOWEL OR 17X24 6PK STRL BLUE (TOWEL DISPOSABLE) ×8 IMPLANT
TRAY FOLEY CATH 14FR (SET/KITS/TRAYS/PACK) ×4 IMPLANT
WATER STERILE IRR 1000ML POUR (IV SOLUTION) ×4 IMPLANT

## 2014-12-09 NOTE — Anesthesia Postprocedure Evaluation (Signed)
  Anesthesia Post Note  Patient: Cassandra Hicks  Procedure(s) Performed: Procedure(s) (LRB): HYSTERECTOMY ABDOMINAL (N/A) SALPINGO OOPHORECTOMY (Bilateral)  Anesthesia type: GA  Patient location: PACU  Post pain: Pain level controlled  Post assessment: Post-op Vital signs reviewed  Last Vitals:  Filed Vitals:   12/09/14 0915  BP: 133/72  Pulse: 68  Temp:   Resp: 16    Post vital signs: Reviewed  Level of consciousness: sedated  Complications: No apparent anesthesia complications

## 2014-12-09 NOTE — Anesthesia Preprocedure Evaluation (Signed)
Anesthesia Evaluation  Patient identified by MRN, date of birth, ID band Patient awake    Reviewed: Allergy & Precautions, NPO status , Patient's Chart, lab work & pertinent test results, reviewed documented beta blocker date and time   History of Anesthesia Complications Negative for: history of anesthetic complications  Airway Mallampati: II  TM Distance: >3 FB Neck ROM: Full    Dental no notable dental hx. (+) Dental Advisory Given   Pulmonary former smoker,  breath sounds clear to auscultation  Pulmonary exam normal       Cardiovascular hypertension, Pt. on medications and Pt. on home beta blockers + Valvular Problems/Murmurs Rhythm:Regular Rate:Normal     Neuro/Psych PSYCHIATRIC DISORDERS Anxiety Depression negative neurological ROS     GI/Hepatic Neg liver ROS, GERD-  Medicated and Controlled,  Endo/Other  negative endocrine ROS  Renal/GU negative Renal ROS  negative genitourinary   Musculoskeletal negative musculoskeletal ROS (+)   Abdominal   Peds negative pediatric ROS (+)  Hematology negative hematology ROS (+)   Anesthesia Other Findings   Reproductive/Obstetrics negative OB ROS                             Anesthesia Physical Anesthesia Plan  ASA: II  Anesthesia Plan: General   Post-op Pain Management:    Induction: Intravenous  Airway Management Planned: Oral ETT  Additional Equipment:   Intra-op Plan:   Post-operative Plan: Extubation in OR  Informed Consent: I have reviewed the patients History and Physical, chart, labs and discussed the procedure including the risks, benefits and alternatives for the proposed anesthesia with the patient or authorized representative who has indicated his/her understanding and acceptance.   Dental advisory given  Plan Discussed with: CRNA  Anesthesia Plan Comments:         Anesthesia Quick Evaluation

## 2014-12-09 NOTE — Anesthesia Procedure Notes (Signed)
Procedure Name: Intubation Date/Time: 12/09/2014 7:30 AM Performed by: Jamesha Ellsworth, Sheron Nightingale Pre-anesthesia Checklist: Patient identified, Patient being monitored, Emergency Drugs available, Timeout performed and Suction available Patient Re-evaluated:Patient Re-evaluated prior to inductionOxygen Delivery Method: Circle system utilized Preoxygenation: Pre-oxygenation with 100% oxygen Intubation Type: IV induction Ventilation: Mask ventilation without difficulty Laryngoscope Size: Mac and 3 Grade View: Grade II Number of attempts: 1 Placement Confirmation: ETT inserted through vocal cords under direct vision,  positive ETCO2 and breath sounds checked- equal and bilateral Secured at: 21 cm Tube secured with: Tape Dental Injury: Teeth and Oropharynx as per pre-operative assessment

## 2014-12-09 NOTE — Transfer of Care (Signed)
Immediate Anesthesia Transfer of Care Note  Patient: Cassandra Hicks  Procedure(s) Performed: Procedure(s): HYSTERECTOMY ABDOMINAL (N/A) SALPINGO OOPHORECTOMY (Bilateral)  Patient Location: PACU  Anesthesia Type:General  Level of Consciousness: awake and alert   Airway & Oxygen Therapy: Patient Spontanous Breathing and Patient connected to nasal cannula oxygen  Post-op Assessment: Report given to RN and Post -op Vital signs reviewed and stable  Post vital signs: Reviewed and stable  Last Vitals:  Filed Vitals:   12/09/14 0607  BP: 135/81  Pulse: 63  Temp: 36.8 C  Resp: 20    Complications: No apparent anesthesia complications

## 2014-12-09 NOTE — OR Nursing (Signed)
During pre procedural interview patient stated what she was having done. During time out surgeon stated that the consent should of had BSO written on it.

## 2014-12-09 NOTE — Progress Notes (Signed)
The patient was re-examined with no change in status 

## 2014-12-09 NOTE — Op Note (Signed)
Preoperative diagnosis: Pelvic pain, left ovarian cyst  Postoperative diagnosis: Same  Procedure: TAH/BSO  Surgeon: Matthew Saras  Asst.: McComb  EBL: 100 cc  Specimens removed: Uterus, bilateral tubes and ovaries, pelvic washings for cytology  Procedure and findings:  The patient was taken the operating room after an adequate level of general anesthesia was obtained with the patient in supine position the abdomen prepped and draped in usual manner for hysterectomy. The vagina was prepped separately and Foley catheter was inserted. Appropriate timeouts taken at that point. Once she was prepped and draped, transverse incision made excising the old scar. This was carried down to the fascia which was extended transversely. Rectus muscle divided in the midline, peritoneum entered sparely without incident and extended in a vertical manner. There were some significant right lower quadrant omental adhesions onto the right anterior abdominal wall that were moderately extensive these were not taken down but were not in the way. Alexis retractor was positioned bowel Speck superiorly out of the field patient placed in Trendelenburg.  Long Kelly clamps were then placed at the utero-ovarian pedicle on either side a 7 cm left smooth-walled ovarian cyst was noted there were no significant excrescences no free fluid noted. Pelvic washings were obtained. Starting on the left the round ligament was coagulated and divided the peritoneum carried around to the midline. Posterior leaf of the broad ligament was then perforated with the surgeon's finger, the left IP ligament was isolated the course of the left pelvic ureter was well below. This was clamped first free tie followed by suture ligature of 0 Vicryl. The exact same repeated on the opposite side were carefully identifying the ureter well below. Once both ovaries were freed up the bladder was dissected below with sharp and blunt dissection, the uterine vasculature  pedicles on either side were skeletonized clamped divided and suture ligated with 0 Vicryl suture. In sequential manner the cardinal ligament, uterosacral ligament and cervical vaginal pedicles were clamped divided and suture ligated with 0 Vicryl suture, the specimen was thus excised. Remainder the vaginal cuff was closed with interrupted 2-0 Vicryl sutures. The pelvis is irrigated with saline carefully inspected noted be hemostatic. Prior to closure, sponge, needle, instrument counts reported as correct 2. Peritoneum was then closed with a 2-0 Vicryl suture. 3-0 Vicryl interrupted sutures with sutures were then used to reapproximate the rectus muscles in the midline. 0 PDS suture was then used from laterally to midline either side a closed fashion. Diluted Exparel solution was then injected into the fascial area and subcutaneous tissue for postoperative pain relief. Subcutaneous tissue was undermined to reduce tension this was made hemostatic with the Bovie was fairly thin was not closed separately, 4-0 Monocryl subcuticular closure with pressure dressing. She tolerated this well went to recovery room in good condition.  Dictated with BurleighD.

## 2014-12-10 ENCOUNTER — Encounter (HOSPITAL_COMMUNITY): Payer: Self-pay | Admitting: Obstetrics and Gynecology

## 2014-12-10 LAB — CBC
HEMATOCRIT: 33.6 % — AB (ref 36.0–46.0)
Hemoglobin: 11.8 g/dL — ABNORMAL LOW (ref 12.0–15.0)
MCH: 31.9 pg (ref 26.0–34.0)
MCHC: 35.1 g/dL (ref 30.0–36.0)
MCV: 90.8 fL (ref 78.0–100.0)
Platelets: 192 10*3/uL (ref 150–400)
RBC: 3.7 MIL/uL — AB (ref 3.87–5.11)
RDW: 12.4 % (ref 11.5–15.5)
WBC: 14.1 10*3/uL — ABNORMAL HIGH (ref 4.0–10.5)

## 2014-12-10 MED ORDER — DIPHENHYDRAMINE HCL 25 MG PO CAPS
25.0000 mg | ORAL_CAPSULE | Freq: Four times a day (QID) | ORAL | Status: DC | PRN
  Filled 2014-12-10: qty 1

## 2014-12-10 MED ORDER — DIPHENHYDRAMINE-ZINC ACETATE 2-0.1 % EX CREA
TOPICAL_CREAM | Freq: Three times a day (TID) | CUTANEOUS | Status: DC | PRN
  Administered 2014-12-10: 1 via TOPICAL
  Filled 2014-12-10: qty 28

## 2014-12-10 MED FILL — Heparin Sodium (Porcine) Inj 5000 Unit/ML: INTRAMUSCULAR | Qty: 1 | Status: AC

## 2014-12-10 NOTE — Progress Notes (Signed)
1 Day Post-Op Procedure(s) (LRB): HYSTERECTOMY ABDOMINAL (N/A) SALPINGO OOPHORECTOMY (Bilateral)  Subjective: Patient reports tolerating PO.    Objective: I have reviewed patient's vital signs and labs.  BP 96/63 mmHg  Pulse 57  Temp(Src) 98.5 F (36.9 C) (Oral)  Resp 18  Ht 5\' 2"  (1.575 m)  Wt 168 lb (76.204 kg)  BMI 30.72 kg/m2  SpO2 94%  LMP  CBC    Component Value Date/Time   WBC 14.1* 12/10/2014 0536   WBC 7.9 09/17/2013 1847   RBC 3.70* 12/10/2014 0536   RBC 4.39 09/17/2013 1847   HGB 11.8* 12/10/2014 0536   HGB 13.6 09/17/2013 1847   HCT 33.6* 12/10/2014 0536   HCT 42.8 09/17/2013 1847   PLT 192 12/10/2014 0536   MCV 90.8 12/10/2014 0536   MCV 97.4* 09/17/2013 1847   MCH 31.9 12/10/2014 0536   MCH 31.0 09/17/2013 1847   MCHC 35.1 12/10/2014 0536   MCHC 31.8 09/17/2013 1847   RDW 12.4 12/10/2014 0536  abd soft + BS, bandage dry    Assessment: s/p Procedure(s): HYSTERECTOMY ABDOMINAL (N/A) SALPINGO OOPHORECTOMY (Bilateral): stable  Plan: Advance diet Advance to PO medication  LOS: 1 day    Mehgan Santmyer M 12/10/2014, 7:57 AM

## 2014-12-10 NOTE — Addendum Note (Signed)
Addendum  created 12/10/14 0809 by Billie Lade, CRNA   Modules edited: Notes Section   Notes Section:  File: [id number]

## 2014-12-10 NOTE — Anesthesia Postprocedure Evaluation (Signed)
  Anesthesia Post-op Note  Anesthesia Post Note  Patient: Cassandra Hicks  Procedure(s) Performed: Procedure(s) (LRB): HYSTERECTOMY ABDOMINAL (N/A) SALPINGO OOPHORECTOMY (Bilateral)  Anesthesia type: General  Patient location: [facility] Unit  Post pain: Pain level controlled  Post assessment: Post-op Vital signs reviewed  Last Vitals:  Filed Vitals:   12/10/14 0627  BP: 96/63  Pulse: 57  Temp: 36.9 C  Resp: 18    Post vital signs: Reviewed  Level of consciousness: sedated  Complications: No apparent anesthesia complications

## 2014-12-11 MED ORDER — IBUPROFEN 800 MG PO TABS: 800.0000 mg | ORAL_TABLET | Freq: Three times a day (TID) | ORAL | Status: DC | PRN

## 2014-12-11 MED ORDER — HYDROCODONE-IBUPROFEN 7.5-200 MG PO TABS: 1.0000 | ORAL_TABLET | Freq: Three times a day (TID) | ORAL | Status: DC | PRN

## 2014-12-11 NOTE — Progress Notes (Signed)
Pt is discharged in the care of husband, with N.T  Escort. Denies any pain or discomfort. Discharged instructions with Rx were given to pt with good understanding. Questions were asked and answered..No equipment needed for home use. Abdominal incisions is clean and dry. Stable.

## 2014-12-11 NOTE — Discharge Summary (Signed)
Physician Discharge Summary  Patient ID: Cassandra Hicks MRN: [id number] DOB/AGE: 58-Dec-1957 58 y.o.  Admit date: 12/09/2014 Discharge date: 12/11/2014  Admission Diagnoses:Ovarian cyst/pelvic pain  Discharge Diagnoses: same Active Problems:   Ovarian cyst   Discharged Condition: good  Hospital Course: adm for TAHBSO for pelvic pain and L ovarian cyst, on POD 1, diet advanced, afeb, on POD 2, afeb, tol PO and ready for D/C  Consults: None  Significant Diagnostic Studies: labs:  CBC    Component Value Date/Time   WBC 14.1* 12/10/2014 0536   WBC 7.9 09/17/2013 1847   RBC 3.70* 12/10/2014 0536   RBC 4.39 09/17/2013 1847   HGB 11.8* 12/10/2014 0536   HGB 13.6 09/17/2013 1847   HCT 33.6* 12/10/2014 0536   HCT 42.8 09/17/2013 1847   PLT 192 12/10/2014 0536   MCV 90.8 12/10/2014 0536   MCV 97.4* 09/17/2013 1847   MCH 31.9 12/10/2014 0536   MCH 31.0 09/17/2013 1847   MCHC 35.1 12/10/2014 0536   MCHC 31.8 09/17/2013 1847   RDW 12.4 12/10/2014 0536      Treatments: surgery: TAHBSO  Discharge Exam: Blood pressure 101/75, pulse 57, temperature 97.9 F (36.6 C), temperature source Oral, resp. rate 16, height 5\' 2"  (1.575 m), weight 168 lb (76.204 kg), SpO2 95 %. abd soft + bs, dressing dry  Disposition: Final discharge disposition not confirmed     Medication List    STOP taking these medications        ciprofloxacin 500 MG tablet  Commonly known as:  CIPRO     diclofenac 75 MG EC tablet  Commonly known as:  VOLTAREN     FLUoxetine 20 MG tablet  Commonly known as:  PROZAC     fluticasone 50 MCG/ACT nasal spray  Commonly known as:  FLONASE     HYDROcodone-acetaminophen 5-325 MG per tablet  Commonly known as:  NORCO     metoprolol succinate 50 MG 24 hr tablet  Commonly known as:  TOPROL-XL     predniSONE 20 MG tablet  Commonly known as:  DELTASONE      TAKE these medications        hydrochlorothiazide 25 MG tablet  Commonly known as:  HYDRODIURIL  Take  1 tablet (25 mg total) by mouth daily.     HYDROcodone-ibuprofen 7.5-200 MG per tablet  Commonly known as:  VICOPROFEN  Take 1 tablet by mouth every 8 (eight) hours as needed for moderate pain.     ibuprofen 800 MG tablet  Commonly known as:  ADVIL,MOTRIN  Take 1 tablet (800 mg total) by mouth every 8 (eight) hours as needed for moderate pain (mild pain).           Follow-up Information    Follow up with Margarette Asal, MD. Schedule an appointment as soon as possible for a visit in 1 week.   Specialty:  Obstetrics and Gynecology   Contact information:   [place] [address] [phone]       Signed: Margarette Asal 12/11/2014, 10:00 AM

## 2015-03-13 ENCOUNTER — Telehealth: Payer: Self-pay

## 2015-03-13 DIAGNOSIS — I10 Essential (primary) hypertension: Secondary | ICD-10-CM

## 2015-03-13 MED ORDER — METOPROLOL SUCCINATE ER 50 MG PO TB24: 50.0000 mg | ORAL_TABLET | Freq: Two times a day (BID) | ORAL | Status: DC

## 2015-03-13 MED ORDER — HYDROCHLOROTHIAZIDE 25 MG PO TABS: 25.0000 mg | ORAL_TABLET | Freq: Every day | ORAL | Status: DC

## 2015-03-13 NOTE — Telephone Encounter (Signed)
Pt called and asked for RFs of metoprolol and HCTZ. I discussed that it has been 1 1/2 yrs since she saw Dr Lorelei Pont for HTN. She was in for back pain in 08/2014. Advised pt that we can send in 1 mos RF to give her time to RTC for check up. Pt agreed.

## 2015-04-13 ENCOUNTER — Other Ambulatory Visit: Payer: Self-pay | Admitting: Family Medicine

## 2015-04-14 ENCOUNTER — Encounter: Payer: Self-pay | Admitting: *Deleted

## 2015-04-17 ENCOUNTER — Other Ambulatory Visit: Payer: Self-pay | Admitting: Family Medicine

## 2015-05-31 ENCOUNTER — Other Ambulatory Visit: Payer: Self-pay | Admitting: Family Medicine

## 2015-06-05 ENCOUNTER — Ambulatory Visit (INDEPENDENT_AMBULATORY_CARE_PROVIDER_SITE_OTHER): Payer: BC Managed Care – PPO | Admitting: Family Medicine

## 2015-06-05 VITALS — BP 120/78 | HR 64 | Temp 98.1°F | Resp 16 | Ht 63.5 in | Wt 177.0 lb

## 2015-06-05 DIAGNOSIS — Z1329 Encounter for screening for other suspected endocrine disorder: Secondary | ICD-10-CM | POA: Diagnosis not present

## 2015-06-05 DIAGNOSIS — Z131 Encounter for screening for diabetes mellitus: Secondary | ICD-10-CM | POA: Diagnosis not present

## 2015-06-05 DIAGNOSIS — Z130 Encounter for screening for diseases of the blood and blood-forming organs and certain disorders involving the immune mechanism: Secondary | ICD-10-CM | POA: Diagnosis not present

## 2015-06-05 DIAGNOSIS — Z23 Encounter for immunization: Secondary | ICD-10-CM

## 2015-06-05 DIAGNOSIS — Z13220 Encounter for screening for lipoid disorders: Secondary | ICD-10-CM

## 2015-06-05 DIAGNOSIS — I10 Essential (primary) hypertension: Secondary | ICD-10-CM

## 2015-06-05 LAB — COMPREHENSIVE METABOLIC PANEL
ALBUMIN: 4.1 g/dL (ref 3.6–5.1)
ALT: 28 U/L (ref 6–29)
AST: 25 U/L (ref 10–35)
Alkaline Phosphatase: 76 U/L (ref 33–130)
BUN: 11 mg/dL (ref 7–25)
CO2: 26 mEq/L (ref 20–31)
CREATININE: 0.73 mg/dL (ref 0.50–1.05)
Calcium: 9.8 mg/dL (ref 8.6–10.4)
Chloride: 106 mEq/L (ref 98–110)
Glucose, Bld: 87 mg/dL (ref 65–99)
POTASSIUM: 4.2 meq/L (ref 3.5–5.3)
Sodium: 141 mEq/L (ref 135–146)
TOTAL PROTEIN: 6.8 g/dL (ref 6.1–8.1)
Total Bilirubin: 0.5 mg/dL (ref 0.2–1.2)

## 2015-06-05 LAB — CBC
HEMATOCRIT: 40.5 % (ref 36.0–46.0)
Hemoglobin: 13.9 g/dL (ref 12.0–15.0)
MCH: 31.3 pg (ref 26.0–34.0)
MCHC: 34.3 g/dL (ref 30.0–36.0)
MCV: 91.2 fL (ref 78.0–100.0)
MPV: 10.9 fL (ref 8.6–12.4)
Platelets: 204 10*3/uL (ref 150–400)
RBC: 4.44 MIL/uL (ref 3.87–5.11)
RDW: 13.6 % (ref 11.5–15.5)
WBC: 7 10*3/uL (ref 4.0–10.5)

## 2015-06-05 LAB — HEMOGLOBIN A1C
Hgb A1c MFr Bld: 5.5 % (ref <5.7)
MEAN PLASMA GLUCOSE: 111 mg/dL (ref <117)

## 2015-06-05 LAB — LIPID PANEL
Cholesterol: 199 mg/dL (ref 125–200)
HDL: 57 mg/dL (ref >=46)
LDL Cholesterol: 108 mg/dL (ref <130)
Total CHOL/HDL Ratio: 3.5 Ratio (ref <=5.0)
Triglycerides: 170 mg/dL — ABNORMAL HIGH (ref <150)
VLDL: 34 mg/dL — AB (ref <30)

## 2015-06-05 LAB — TSH: TSH: 1.807 u[IU]/mL (ref 0.350–4.500)

## 2015-06-05 MED ORDER — HYDROCHLOROTHIAZIDE 25 MG PO TABS: 25.0000 mg | ORAL_TABLET | Freq: Every day | ORAL | Status: DC

## 2015-06-05 MED ORDER — METOPROLOL SUCCINATE ER 50 MG PO TB24: ORAL_TABLET | ORAL | Status: DC

## 2015-06-05 NOTE — Progress Notes (Signed)
[facility] [address], [place] [address] [phone]  Date:  06/05/2015   Name:  KENSLY BOWMER   DOB:  06/24/1956   MRN:  [id number]  PCP:  Ruben Reason, MD    Chief Complaint: Medication Refill   History of Present Illness:  Cassandra Hicks is a 59 y.o. very pleasant female patient who presents with the following:  History of HTN- here today for a med refill She is fasting for labs She sees OBG for her pap and breast exam; Dr. Matthew Saras Her GI doctor is Dr. Collene Mares- she has her colonooscopy every 5 years She is due for a Tdap today  She had a total hysterectomy in February of this year per Dr. Matthew Saras- no cancer.    BP Readings from Last 3 Encounters:  06/05/15 120/78  12/11/14 101/75  12/03/14 124/81   Wt Readings from Last 3 Encounters:  06/05/15 177 lb (80.287 kg)  12/09/14 168 lb (76.204 kg)  12/03/14 170 lb (77.111 kg)     Patient Active Problem List   Diagnosis Date Noted  . Ovarian cyst 12/09/2014  . HTN (hypertension) 12/08/2011    Past Medical History  Diagnosis Date  . Hypertension   . Heart murmur     as child, no problem  . GERD (gastroesophageal reflux disease)     mild, no meds, diet controlled  . SVD (spontaneous vaginal delivery)     x 2  . Missed ab     x 2  D&E  . Anxiety     Past Surgical History  Procedure Laterality Date  . Appendectomy    . Ectopic pregnancy surgery      x 2  . Colonoscopy    . Dilation and curettage of uterus      x 2 for MAB  . Wisdom tooth extraction    . Tonsillectomy    . Tubal ligation    . Eye surgery      bilateral - Radial Keratotomy   . Abdominal hysterectomy N/A 12/09/2014    Procedure: HYSTERECTOMY ABDOMINAL;  Surgeon: Margarette Asal, MD;  Location: [facility];  Service: Gynecology;  Laterality: N/A;  . Salpingoophorectomy Bilateral 12/09/2014    Procedure: SALPINGO OOPHORECTOMY;  Surgeon: Margarette Asal, MD;  Location: [facility];  Service: Gynecology;  Laterality: Bilateral;     History  Substance Use Topics  . Smoking status: Former Smoker -- 1.00 packs/day for 30 years    Types: Cigarettes    Quit date: 12/07/1996  . Smokeless tobacco: Never Used  . Alcohol Use: 7.2 oz/week    12 Cans of beer per week     Comment: social/weekends    Family History  Problem Relation Age of Onset  . COPD Mother   . Cancer Father     lung  . Hypertension Father   . Hypertension Sister   . Hypertension Brother     Allergies  Allergen Reactions  . Penicillins Itching    Medication list has been reviewed and updated.  Current Outpatient Prescriptions on File Prior to Visit  Medication Sig Dispense Refill  . hydrochlorothiazide (HYDRODIURIL) 25 MG tablet TAKE 1 TABLET (25 MG TOTAL) BY MOUTH DAILY. PATIENT NEEDS OFFICE VISIT FOR ADDITIONAL REFILLS 15 tablet 0  . metoprolol succinate (TOPROL-XL) 50 MG 24 hr tablet TAKE 1 TABLET (50 MG TOTAL) BY MOUTH 2 (TWO) TIMES DAILY. NEEDS OFFICE VISIT/LABS FOR ADD'L REFILLS 30 tablet 0  . HYDROcodone-ibuprofen (VICOPROFEN) 7.5-200 MG per  tablet Take 1 tablet by mouth every 8 (eight) hours as needed for moderate pain. (Patient not taking: Reported on 06/05/2015) 30 tablet 0  . ibuprofen (ADVIL,MOTRIN) 800 MG tablet Take 1 tablet (800 mg total) by mouth every 8 (eight) hours as needed for moderate pain (mild pain). (Patient not taking: Reported on 06/05/2015) 30 tablet 1   No current facility-administered medications on file prior to visit.    Review of Systems:  As per HPI- otherwise negative.   Physical Examination: Filed Vitals:   06/05/15 0830  BP: 120/78  Pulse: 64  Temp: 98.1 F (36.7 C)  Resp: 16   Filed Vitals:   06/05/15 0830  Height: 5' 3.5" (1.613 m)  Weight: 177 lb (80.287 kg)   Body mass index is 30.86 kg/(m^2). Ideal Body Weight: Weight in (lb) to have BMI = 25: 143.1  GEN: WDWN, NAD, Non-toxic, A & O x 3, obese, looks well HEENT: Atraumatic, Normocephalic. Neck supple. No masses, No LAD. Ears and  Nose: No external deformity. CV: RRR, No M/G/R. No JVD. No thrill. No extra heart sounds. PULM: CTA B, no wheezes, crackles, rhonchi. No retractions. No resp. distress. No accessory muscle use. ABD: S, NT, ND, +BS. No rebound. No HSM. EXTR: No c/c/e NEURO Normal gait.  PSYCH: Normally interactive. Conversant. Not depressed or anxious appearing.  Calm demeanor.    Assessment and Plan: Essential hypertension - Plan: hydrochlorothiazide (HYDRODIURIL) 25 MG tablet, metoprolol succinate (TOPROL-XL) 50 MG 24 hr tablet  Screening for hyperlipidemia - Plan: Lipid panel  Screening for deficiency anemia - Plan: CBC  Screening for hypothyroidism - Plan: TSH  Immunization due - Plan: Tdap vaccine greater than or equal to 7yo IM  Screening for diabetes mellitus - Plan: Comprehensive metabolic panel, Hemoglobin A1c  BP well controlled, did her RF today Update tdap Await labs and will follow-up with her  Signed Lamar Blinks, MD

## 2015-06-05 NOTE — Patient Instructions (Signed)
Good to see you today! Continue to work on your weight through a diet rich in vegetables and exercise I will be in touch with your labs You got your tetanus shot today Please see [place] in about 6 months for a recheck- you are welcome to make an appointment if you prefer If you could ask your specialists to send reports (colonoscopy, etc) to me that would be great

## 2016-08-02 ENCOUNTER — Other Ambulatory Visit: Payer: Self-pay | Admitting: Obstetrics and Gynecology

## 2016-08-02 DIAGNOSIS — N644 Mastodynia: Secondary | ICD-10-CM

## 2016-08-05 ENCOUNTER — Other Ambulatory Visit: Payer: Self-pay | Admitting: Obstetrics and Gynecology

## 2016-08-05 ENCOUNTER — Ambulatory Visit
Admission: RE | Admit: 2016-08-05 | Discharge: 2016-08-05 | Disposition: A | Discharge status: Home / Self Care | Payer: BC Managed Care – PPO | Source: Ambulatory Visit | Attending: Obstetrics and Gynecology | Admitting: Obstetrics and Gynecology

## 2016-08-05 DIAGNOSIS — G8929 Other chronic pain: Secondary | ICD-10-CM

## 2016-08-05 DIAGNOSIS — R1031 Right lower quadrant pain: Principal | ICD-10-CM

## 2016-08-05 DIAGNOSIS — N644 Mastodynia: Secondary | ICD-10-CM

## 2016-08-05 DIAGNOSIS — N6320 Unspecified lump in the left breast, unspecified quadrant: Secondary | ICD-10-CM

## 2016-08-10 ENCOUNTER — Ambulatory Visit
Admission: RE | Admit: 2016-08-10 | Discharge: 2016-08-10 | Disposition: A | Discharge status: Home / Self Care | Payer: BC Managed Care – PPO | Source: Ambulatory Visit | Attending: Obstetrics and Gynecology | Admitting: Obstetrics and Gynecology

## 2016-08-10 ENCOUNTER — Other Ambulatory Visit: Payer: Self-pay | Admitting: Obstetrics and Gynecology

## 2016-08-10 DIAGNOSIS — N6320 Unspecified lump in the left breast, unspecified quadrant: Secondary | ICD-10-CM

## 2016-08-10 HISTORY — PX: BREAST BIOPSY: SHX20

## 2016-08-24 ENCOUNTER — Other Ambulatory Visit: Payer: Self-pay | Admitting: General Surgery

## 2016-08-24 DIAGNOSIS — R928 Other abnormal and inconclusive findings on diagnostic imaging of breast: Secondary | ICD-10-CM

## 2016-09-01 ENCOUNTER — Other Ambulatory Visit: Payer: Self-pay | Admitting: General Surgery

## 2016-09-01 DIAGNOSIS — R928 Other abnormal and inconclusive findings on diagnostic imaging of breast: Secondary | ICD-10-CM

## 2016-09-08 HISTORY — PX: BREAST EXCISIONAL BIOPSY: SUR124

## 2016-09-10 ENCOUNTER — Encounter (HOSPITAL_BASED_OUTPATIENT_CLINIC_OR_DEPARTMENT_OTHER): Payer: Self-pay | Admitting: *Deleted

## 2016-09-14 ENCOUNTER — Encounter (HOSPITAL_BASED_OUTPATIENT_CLINIC_OR_DEPARTMENT_OTHER)
Admission: RE | Admit: 2016-09-14 | Discharge: 2016-09-14 | Disposition: A | Discharge status: Home / Self Care | Payer: BC Managed Care – PPO | Source: Ambulatory Visit | Attending: General Surgery | Admitting: General Surgery

## 2016-09-14 DIAGNOSIS — Z8249 Family history of ischemic heart disease and other diseases of the circulatory system: Secondary | ICD-10-CM | POA: Diagnosis not present

## 2016-09-14 DIAGNOSIS — Z86010 Personal history of colonic polyps: Secondary | ICD-10-CM | POA: Diagnosis not present

## 2016-09-14 DIAGNOSIS — E669 Obesity, unspecified: Secondary | ICD-10-CM | POA: Diagnosis not present

## 2016-09-14 DIAGNOSIS — F419 Anxiety disorder, unspecified: Secondary | ICD-10-CM | POA: Diagnosis not present

## 2016-09-14 DIAGNOSIS — Z90710 Acquired absence of both cervix and uterus: Secondary | ICD-10-CM | POA: Diagnosis not present

## 2016-09-14 DIAGNOSIS — Z801 Family history of malignant neoplasm of trachea, bronchus and lung: Secondary | ICD-10-CM | POA: Diagnosis not present

## 2016-09-14 DIAGNOSIS — Z8349 Family history of other endocrine, nutritional and metabolic diseases: Secondary | ICD-10-CM | POA: Diagnosis not present

## 2016-09-14 DIAGNOSIS — Z79899 Other long term (current) drug therapy: Secondary | ICD-10-CM | POA: Diagnosis not present

## 2016-09-14 DIAGNOSIS — I10 Essential (primary) hypertension: Secondary | ICD-10-CM | POA: Diagnosis not present

## 2016-09-14 DIAGNOSIS — N6022 Fibroadenosis of left breast: Secondary | ICD-10-CM | POA: Diagnosis not present

## 2016-09-14 DIAGNOSIS — Z820 Family history of epilepsy and other diseases of the nervous system: Secondary | ICD-10-CM | POA: Diagnosis not present

## 2016-09-14 DIAGNOSIS — K219 Gastro-esophageal reflux disease without esophagitis: Secondary | ICD-10-CM | POA: Diagnosis not present

## 2016-09-14 DIAGNOSIS — R011 Cardiac murmur, unspecified: Secondary | ICD-10-CM | POA: Diagnosis not present

## 2016-09-14 DIAGNOSIS — N6092 Unspecified benign mammary dysplasia of left breast: Secondary | ICD-10-CM | POA: Diagnosis not present

## 2016-09-14 DIAGNOSIS — Z87891 Personal history of nicotine dependence: Secondary | ICD-10-CM | POA: Diagnosis not present

## 2016-09-14 DIAGNOSIS — Z6831 Body mass index (BMI) 31.0-31.9, adult: Secondary | ICD-10-CM | POA: Diagnosis not present

## 2016-09-14 DIAGNOSIS — Z823 Family history of stroke: Secondary | ICD-10-CM | POA: Diagnosis not present

## 2016-09-14 DIAGNOSIS — Z832 Family history of diseases of the blood and blood-forming organs and certain disorders involving the immune mechanism: Secondary | ICD-10-CM | POA: Diagnosis not present

## 2016-09-14 DIAGNOSIS — Z90721 Acquired absence of ovaries, unilateral: Secondary | ICD-10-CM | POA: Diagnosis not present

## 2016-09-14 DIAGNOSIS — Z836 Family history of other diseases of the respiratory system: Secondary | ICD-10-CM | POA: Diagnosis not present

## 2016-09-14 DIAGNOSIS — Z8371 Family history of colonic polyps: Secondary | ICD-10-CM | POA: Diagnosis not present

## 2016-09-14 DIAGNOSIS — Z880 Allergy status to penicillin: Secondary | ICD-10-CM | POA: Diagnosis not present

## 2016-09-14 DIAGNOSIS — Z8052 Family history of malignant neoplasm of bladder: Secondary | ICD-10-CM | POA: Diagnosis not present

## 2016-09-14 DIAGNOSIS — N63 Unspecified lump in unspecified breast: Secondary | ICD-10-CM | POA: Diagnosis present

## 2016-09-14 LAB — CBC WITH DIFFERENTIAL/PLATELET
Basophils Absolute: 0.1 10*3/uL (ref 0.0–0.1)
Basophils Relative: 1 %
EOS ABS: 0.3 10*3/uL (ref 0.0–0.7)
EOS PCT: 4 %
HCT: 41.1 % (ref 36.0–46.0)
Hemoglobin: 14.3 g/dL (ref 12.0–15.0)
LYMPHS ABS: 3.1 10*3/uL (ref 0.7–4.0)
LYMPHS PCT: 38 %
MCH: 31.8 pg (ref 26.0–34.0)
MCHC: 34.8 g/dL (ref 30.0–36.0)
MCV: 91.3 fL (ref 78.0–100.0)
MONOS PCT: 7 %
Monocytes Absolute: 0.6 10*3/uL (ref 0.1–1.0)
Neutro Abs: 4.2 10*3/uL (ref 1.7–7.7)
Neutrophils Relative %: 50 %
PLATELETS: 254 10*3/uL (ref 150–400)
RBC: 4.5 MIL/uL (ref 3.87–5.11)
RDW: 12.6 % (ref 11.5–15.5)
WBC: 8.3 10*3/uL (ref 4.0–10.5)

## 2016-09-14 LAB — COMPREHENSIVE METABOLIC PANEL
ALK PHOS: 79 U/L (ref 38–126)
ALT: 31 U/L (ref 14–54)
ANION GAP: 8 (ref 5–15)
AST: 26 U/L (ref 15–41)
Albumin: 4.1 g/dL (ref 3.5–5.0)
BUN: 8 mg/dL (ref 6–20)
CALCIUM: 9.6 mg/dL (ref 8.9–10.3)
CHLORIDE: 102 mmol/L (ref 101–111)
CO2: 28 mmol/L (ref 22–32)
CREATININE: 0.79 mg/dL (ref 0.44–1.00)
Glucose, Bld: 89 mg/dL (ref 65–99)
Potassium: 3.7 mmol/L (ref 3.5–5.1)
SODIUM: 138 mmol/L (ref 135–145)
Total Bilirubin: 0.9 mg/dL (ref 0.3–1.2)
Total Protein: 7 g/dL (ref 6.5–8.1)

## 2016-09-15 NOTE — H&P (Signed)
Cassandra Hicks Location: [facility] Patient #: [id number] DOB: [birth year] Married / Language: English / Race: White Female        History of Present Illness       The patient is a 60 year old female who presents with a breast mass. This is a pleasant, 60 year old Caucasian female, referred by Dr. Ammie Ferrier at the breast center where his workup for evaluation complex sclerosing lesion left breast, 12:30 position. Dr. Matthew Saras is her gynecologist. She sees urgent medical care for primary care issues.      She gets near her annual screening mammograms. She is noticed some pain in the central and upper left breast for about a month and a half but no nipple discharge or skin changes. Mammograms and ultrasound showed a small area of distortion in the left breast at the 12 to 12:30 position. There was also a benign-appearing mass in the left breast at the 11:30 position. The left axilla was normal by ultrasound. The right breast normal.      Both lesions were biopsied. 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 In the upper outer quadrant, probably about 12:30 there was a complex sclerosing lesion.      Past history reveals no breast problems. Borderline hypertension. TAH and BSO through Pfannenstiel incision for benign disease. Family history is negative for breast, ovarian, colon, or pancreatic cancer. Mother living has lung cancer and COPD. Father died had lung cancer. Social history reveals she is married. Lives in [place] has 2 children and works as an Web designer at [employer]. Quit smoking 20 years ago. Drinks alcohol occasionally.      We had a very lengthy discussion about the statistics surrounding this lesion. She knows that our literature is variable and shows anywhere from 2-9% chance of upgrading this to atypia or in situ cancer. She knows that most likely this is not cancer. I offered lumpectomy with  radioactive seed localization to confirm the diagnosis. We also talked about close clinical follow-up 6 months imaging and exam. After a lengthy discussion she decided that she wanted to go ahead and schedule for left breast lumpectomy with radioactive seed localization. I discussed the indications, details, techniques, and numerous risk of the surgery with her. She is aware of the risk of bleeding, infection, cosmetic deformity, reoperation of cancer, nerve damage and chronic pain or numbness. All of her questions are answered. This time she understands all these issues and she requests that we proceed. She stated that she is going to see her gynecologist and ask his opinion as well. If she changes her mind we will simply see her back in 6 months   Other Problems  Anxiety Disorder Gastroesophageal Reflux Disease Heart murmur High blood pressure Oophorectomy  Past Surgical History Appendectomy Breast Biopsy Left. Colon Polyp Removal - Colonoscopy Colon Polyp Removal - Open Hysterectomy (not due to cancer) - Complete Oral Surgery Tonsillectomy  Diagnostic Studies History  Colonoscopy 5-10 years ago Mammogram within last year Pap Smear 1-5 years ago  Allergies  Penicillin V *PENICILLINS*  Medication History  HydroCHLOROthiazide (25MG  Tablet, Oral) Active. Metoprolol Succinate ER (50MG  Tablet ER 24HR, Oral) Active. Medications Reconciled  Social Histor) Alcohol use Moderate alcohol use. Caffeine use Coffee, Tea. Illicit drug use Uses socially only. Tobacco use Former smoker.  Family History  Bleeding disorder Brother, Sister. Cancer Father, Mother. Cerebrovascular Accident Brother. Colon Polyps Family Members In General. Heart disease in female family member before  age 31 Hypertension Mother, Sister. Respiratory Condition Mother. Seizure disorder Brother, Sister. Thyroid problems Sister.  Pregnancy / Birth History  Age at  menarche 71 years. Age of menopause 25-50 Contraceptive History Oral contraceptives. Gravida 5 Irregular periods Length (months) of breastfeeding 3-6 Maternal age 64-25 Para 2    Review of Systems  General Not Present- Appetite Loss, Chills, Fatigue, Fever, Night Sweats, Weight Gain and Weight Loss. Skin Not Present- Change in Wart/Mole, Dryness, Hives, Jaundice, New Lesions, Non-Healing Wounds, Rash and Ulcer. HEENT Present- Seasonal Allergies. Not Present- Earache, Hearing Loss, Hoarseness, Nose Bleed, Oral Ulcers, Ringing in the Ears, Sinus Pain, Sore Throat, Visual Disturbances, Wears glasses/contact lenses and Yellow Eyes. Respiratory Not Present- Bloody sputum, Chronic Cough, Difficulty Breathing, Snoring and Wheezing. Breast Present- Breast Mass and Breast Pain. Not Present- Nipple Discharge and Skin Changes. Cardiovascular Not Present- Chest Pain, Difficulty Breathing Lying Down, Leg Cramps, Palpitations, Rapid Heart Rate, Shortness of Breath and Swelling of Extremities. Gastrointestinal Present- Indigestion. Not Present- Abdominal Pain, Bloating, Bloody Stool, Change in Bowel Habits, Chronic diarrhea, Constipation, Difficulty Swallowing, Excessive gas, Gets full quickly at meals, Hemorrhoids, Nausea, Rectal Pain and Vomiting. Female Genitourinary Present- Frequency and Pelvic Pain. Not Present- Nocturia, Painful Urination and Urgency. Musculoskeletal Not Present- Back Pain, Joint Pain, Joint Stiffness, Muscle Pain, Muscle Weakness and Swelling of Extremities. Neurological Not Present- Decreased Memory, Fainting, Headaches, Numbness, Seizures, Tingling, Tremor, Trouble walking and Weakness. Psychiatric Not Present- Anxiety, Bipolar, Change in Sleep Pattern, Depression, Fearful and Frequent crying. Endocrine Not Present- Cold Intolerance, Excessive Hunger, Hair Changes, Heat Intolerance, Hot flashes and New Diabetes. Hematology Not Present- Blood Thinners, Easy Bruising,  Excessive bleeding, Gland problems, HIV and Persistent Infections.  Vitals  Weight: 175 lb Height: 62in Body Surface Area: 1.81 m Body Mass Index: 32.01 kg/m  Temp.: 5F(Temporal)  Pulse: 73 (Regular)  BP: 128/80 (Sitting, Left Arm, Standard)     Physical Exam  General Mental Status-Alert. General Appearance-Consistent with stated age. Hydration-Well hydrated. Voice-Normal.  Head and Neck Head-normocephalic, atraumatic with no lesions or palpable masses. Trachea-midline. Thyroid Gland Characteristics - normal size and consistency.  Eye Eyeball - Bilateral-Extraocular movements intact. Sclera/Conjunctiva - Bilateral-No scleral icterus.  Chest and Lung Exam Chest and lung exam reveals -quiet, even and easy respiratory effort with no use of accessory muscles and on auscultation, normal breath sounds, no adventitious sounds and normal vocal resonance. Inspection Chest Wall - Normal. Back - normal.  Breast Note: Breasts are medium sized. Small amount of ecchymoses above the areola on the left. No palpable mass in either breast. No other skin change. No axillary adenopathy.   Cardiovascular Cardiovascular examination reveals -normal heart sounds, regular rate and rhythm with no murmurs and normal pedal pulses bilaterally.  Abdomen Inspection Inspection of the abdomen reveals - No Hernias. Skin - Scar - Note: Healed Pfannenstiel incision. Palpation/Percussion Palpation and Percussion of the abdomen reveal - Soft, Non Tender, No Rebound tenderness, No Rigidity (guarding) and No hepatosplenomegaly. Auscultation Auscultation of the abdomen reveals - Bowel sounds normal.  Neurologic Neurologic evaluation reveals -alert and oriented x 3 with no impairment of recent or remote memory. Mental Status-Normal.  Musculoskeletal Normal Exam - Left-Upper Extremity Strength Normal and Lower Extremity Strength Normal. Normal Exam -  Right-Upper Extremity Strength Normal and Lower Extremity Strength Normal.  Lymphatic Head & Neck  General Head & Neck Lymphatics: Bilateral - Description - Normal. Axillary  General Axillary Region: Bilateral - Description - Normal. Tenderness - Non Tender. Femoral & Inguinal  Generalized Femoral &  Inguinal Lymphatics: Bilateral - Description - Normal. Tenderness - Non Tender.   Assessment and Plan ABNORMAL MAMMOGRAM OF LEFT BREAST (R92.8) Current Plans Pt Education - Pamphlet Given - Breast Biopsy: discussed with patient and provided information. You are being scheduled for surgery - Our schedulers will call you. You should hear from our office's scheduling department within 5 working days about the location, date, and time of surgery. We try to make accommodations for patient's preferences in scheduling surgery, but sometimes the OR schedule or the surgeon's schedule prevents [place] from making those accommodations. If you have other questions about your diagnosis, plan, or surgery, call the office and ask for your surgeon's nurse.   Your recent mammograms show a distortion in the left breast at about the 12:30 position, slightly upper outer quadrant. There was also a second area at the 11 o'clock position Both of these areas were biopsied. One of these is a benign fibroadenoma and nothing needs to be done about that The second area, upper outer rest, shows a complex sclerosing lesion This is most likely not cancer. Review of the literature shows a 2-9% risk of upgrading this lesion either to atypia or in situ cancer  We have discussed the approaches of lumpectomy and compared that to the approach of close clinical follow-up with 6 months imaging and exam You have decided to go ahead and schedule left breast lumpectomy with radioactive seed localization We have discussed indications, techniques, and numerous risk of this surgery.  HYPERTENSION, BENIGN (I10) BMI 32.0-32.9,ADULT  (Z68.32) FORMER SMOKER [id number])   Edsel Petrin. Dalbert Batman, M.D., [facility] General and Minimally invasive Surgery Breast and Colorectal Surgery Office:   [phone] Pager:   [phone]

## 2016-09-16 ENCOUNTER — Ambulatory Visit
Admission: RE | Admit: 2016-09-16 | Discharge: 2016-09-16 | Disposition: A | Discharge status: Home / Self Care | Payer: BC Managed Care – PPO | Source: Ambulatory Visit | Attending: General Surgery | Admitting: General Surgery

## 2016-09-16 DIAGNOSIS — R928 Other abnormal and inconclusive findings on diagnostic imaging of breast: Secondary | ICD-10-CM

## 2016-09-16 NOTE — Anesthesia Preprocedure Evaluation (Addendum)
Anesthesia Evaluation  Patient identified by MRN, date of birth, ID band Patient awake    Reviewed: Allergy & Precautions, NPO status , Patient's Chart, lab work & pertinent test results, reviewed documented beta blocker date and time   History of Anesthesia Complications Negative for: history of anesthetic complications  Airway Mallampati: II  TM Distance: >3 FB Neck ROM: Full    Dental  (+) Teeth Intact, Dental Advisory Given   Pulmonary former smoker,    Pulmonary exam normal breath sounds clear to auscultation       Cardiovascular hypertension, Pt. on home beta blockers and Pt. on medications (-) angina(-) CAD, (-) Past MI and (-) CHF Normal cardiovascular exam Rhythm:Regular Rate:Normal     Neuro/Psych PSYCHIATRIC DISORDERS Anxiety negative neurological ROS     GI/Hepatic Neg liver ROS, GERD (diet controlled)  Controlled,  Endo/Other  Obesity   Renal/GU negative Renal ROS     Musculoskeletal negative musculoskeletal ROS (+)   Abdominal   Peds  Hematology negative hematology ROS (+)   Anesthesia Other Findings Day of surgery medications reviewed with the patient.  Left breast mass   Reproductive/Obstetrics                            Anesthesia Physical Anesthesia Plan  ASA: II  Anesthesia Plan: General   Post-op Pain Management:    Induction: Intravenous  Airway Management Planned: LMA  Additional Equipment:   Intra-op Plan:   Post-operative Plan: Extubation in OR  Informed Consent: I have reviewed the patients History and Physical, chart, labs and discussed the procedure including the risks, benefits and alternatives for the proposed anesthesia with the patient or authorized representative who has indicated his/her understanding and acceptance.   Dental advisory given  Plan Discussed with: CRNA  Anesthesia Plan Comments: (Risks/benefits of general anesthesia  discussed with patient including risk of damage to teeth, lips, gum, and tongue, nausea/vomiting, allergic reactions to medications, and the possibility of heart attack, stroke and death.  All patient questions answered.  Patient wishes to proceed.)        Anesthesia Quick Evaluation

## 2016-09-17 ENCOUNTER — Encounter (HOSPITAL_BASED_OUTPATIENT_CLINIC_OR_DEPARTMENT_OTHER): Payer: Self-pay | Admitting: *Deleted

## 2016-09-17 ENCOUNTER — Ambulatory Visit (HOSPITAL_BASED_OUTPATIENT_CLINIC_OR_DEPARTMENT_OTHER): Payer: BC Managed Care – PPO | Admitting: Anesthesiology

## 2016-09-17 ENCOUNTER — Ambulatory Visit
Admission: RE | Admit: 2016-09-17 | Discharge: 2016-09-17 | Disposition: A | Discharge status: Home / Self Care | Payer: BC Managed Care – PPO | Source: Ambulatory Visit | Attending: General Surgery | Admitting: General Surgery

## 2016-09-17 ENCOUNTER — Ambulatory Visit (HOSPITAL_BASED_OUTPATIENT_CLINIC_OR_DEPARTMENT_OTHER)
Admission: RE | Admit: 2016-09-17 | Discharge: 2016-09-17 | Disposition: A | Discharge status: Home / Self Care | Payer: BC Managed Care – PPO | Source: Ambulatory Visit | Attending: General Surgery | Admitting: General Surgery

## 2016-09-17 ENCOUNTER — Encounter (HOSPITAL_BASED_OUTPATIENT_CLINIC_OR_DEPARTMENT_OTHER)
Admission: RE | Disposition: A | Discharge status: Home / Self Care | Payer: Self-pay | Source: Ambulatory Visit | Attending: General Surgery

## 2016-09-17 DIAGNOSIS — Z8052 Family history of malignant neoplasm of bladder: Secondary | ICD-10-CM | POA: Insufficient documentation

## 2016-09-17 DIAGNOSIS — Z801 Family history of malignant neoplasm of trachea, bronchus and lung: Secondary | ICD-10-CM | POA: Insufficient documentation

## 2016-09-17 DIAGNOSIS — R928 Other abnormal and inconclusive findings on diagnostic imaging of breast: Secondary | ICD-10-CM

## 2016-09-17 DIAGNOSIS — Z820 Family history of epilepsy and other diseases of the nervous system: Secondary | ICD-10-CM | POA: Insufficient documentation

## 2016-09-17 DIAGNOSIS — Z6831 Body mass index (BMI) 31.0-31.9, adult: Secondary | ICD-10-CM | POA: Insufficient documentation

## 2016-09-17 DIAGNOSIS — Z87891 Personal history of nicotine dependence: Secondary | ICD-10-CM | POA: Insufficient documentation

## 2016-09-17 DIAGNOSIS — Z79899 Other long term (current) drug therapy: Secondary | ICD-10-CM | POA: Insufficient documentation

## 2016-09-17 DIAGNOSIS — R011 Cardiac murmur, unspecified: Secondary | ICD-10-CM | POA: Insufficient documentation

## 2016-09-17 DIAGNOSIS — N6092 Unspecified benign mammary dysplasia of left breast: Secondary | ICD-10-CM | POA: Insufficient documentation

## 2016-09-17 DIAGNOSIS — Z836 Family history of other diseases of the respiratory system: Secondary | ICD-10-CM | POA: Insufficient documentation

## 2016-09-17 DIAGNOSIS — N6022 Fibroadenosis of left breast: Secondary | ICD-10-CM | POA: Diagnosis not present

## 2016-09-17 DIAGNOSIS — Z86010 Personal history of colonic polyps: Secondary | ICD-10-CM | POA: Insufficient documentation

## 2016-09-17 DIAGNOSIS — K219 Gastro-esophageal reflux disease without esophagitis: Secondary | ICD-10-CM | POA: Insufficient documentation

## 2016-09-17 DIAGNOSIS — Z8249 Family history of ischemic heart disease and other diseases of the circulatory system: Secondary | ICD-10-CM | POA: Insufficient documentation

## 2016-09-17 DIAGNOSIS — Z880 Allergy status to penicillin: Secondary | ICD-10-CM | POA: Insufficient documentation

## 2016-09-17 DIAGNOSIS — Z90721 Acquired absence of ovaries, unilateral: Secondary | ICD-10-CM | POA: Insufficient documentation

## 2016-09-17 DIAGNOSIS — Z8349 Family history of other endocrine, nutritional and metabolic diseases: Secondary | ICD-10-CM | POA: Insufficient documentation

## 2016-09-17 DIAGNOSIS — Z90710 Acquired absence of both cervix and uterus: Secondary | ICD-10-CM | POA: Insufficient documentation

## 2016-09-17 DIAGNOSIS — Z832 Family history of diseases of the blood and blood-forming organs and certain disorders involving the immune mechanism: Secondary | ICD-10-CM | POA: Insufficient documentation

## 2016-09-17 DIAGNOSIS — Z823 Family history of stroke: Secondary | ICD-10-CM | POA: Insufficient documentation

## 2016-09-17 DIAGNOSIS — Z8371 Family history of colonic polyps: Secondary | ICD-10-CM | POA: Insufficient documentation

## 2016-09-17 DIAGNOSIS — F419 Anxiety disorder, unspecified: Secondary | ICD-10-CM | POA: Insufficient documentation

## 2016-09-17 DIAGNOSIS — E669 Obesity, unspecified: Secondary | ICD-10-CM | POA: Insufficient documentation

## 2016-09-17 DIAGNOSIS — I10 Essential (primary) hypertension: Secondary | ICD-10-CM | POA: Insufficient documentation

## 2016-09-17 HISTORY — DX: Unspecified lump in the left breast, unspecified quadrant: N63.20

## 2016-09-17 HISTORY — DX: Other abnormal and inconclusive findings on diagnostic imaging of breast: R92.8

## 2016-09-17 HISTORY — PX: BREAST LUMPECTOMY WITH RADIOACTIVE SEED LOCALIZATION: SHX6424

## 2016-09-17 SURGERY — BREAST LUMPECTOMY WITH RADIOACTIVE SEED LOCALIZATION
Anesthesia: General | Site: Breast | Laterality: Left

## 2016-09-17 MED ORDER — BUPIVACAINE HCL (PF) 0.25 % IJ SOLN
INTRAMUSCULAR | Status: DC | PRN
  Administered 2016-09-17: 10 mL

## 2016-09-17 MED ORDER — EPHEDRINE 5 MG/ML INJ
INTRAVENOUS | Status: AC
  Filled 2016-09-17: qty 10

## 2016-09-17 MED ORDER — CHLORHEXIDINE GLUCONATE CLOTH 2 % EX PADS: 6.0000 | MEDICATED_PAD | Freq: Once | CUTANEOUS | Status: DC

## 2016-09-17 MED ORDER — EPHEDRINE SULFATE-NACL 50-0.9 MG/10ML-% IV SOSY
PREFILLED_SYRINGE | INTRAVENOUS | Status: DC | PRN
  Administered 2016-09-17: 15 mg via INTRAVENOUS

## 2016-09-17 MED ORDER — EPHEDRINE SULFATE 50 MG/ML IJ SOLN: INTRAMUSCULAR | Status: DC | PRN

## 2016-09-17 MED ORDER — ACETAMINOPHEN 500 MG PO TABS
1000.0000 mg | ORAL_TABLET | ORAL | Status: AC
  Administered 2016-09-17: 1000 mg via ORAL

## 2016-09-17 MED ORDER — PROPOFOL 10 MG/ML IV BOLUS
INTRAVENOUS | Status: DC | PRN
  Administered 2016-09-17: 150 mg via INTRAVENOUS

## 2016-09-17 MED ORDER — ACETAMINOPHEN 500 MG PO TABS
ORAL_TABLET | ORAL | Status: AC
  Filled 2016-09-17: qty 2

## 2016-09-17 MED ORDER — DEXAMETHASONE SODIUM PHOSPHATE 4 MG/ML IJ SOLN
INTRAMUSCULAR | Status: DC | PRN
  Administered 2016-09-17: 10 mg via INTRAVENOUS

## 2016-09-17 MED ORDER — CELECOXIB 200 MG PO CAPS
ORAL_CAPSULE | ORAL | Status: AC
  Filled 2016-09-17: qty 2

## 2016-09-17 MED ORDER — GABAPENTIN 300 MG PO CAPS
300.0000 mg | ORAL_CAPSULE | ORAL | Status: AC
  Administered 2016-09-17: 300 mg via ORAL

## 2016-09-17 MED ORDER — FENTANYL CITRATE (PF) 100 MCG/2ML IJ SOLN
50.0000 ug | INTRAMUSCULAR | Status: DC | PRN
  Administered 2016-09-17: 100 ug via INTRAVENOUS

## 2016-09-17 MED ORDER — HYDROCODONE-ACETAMINOPHEN 5-325 MG PO TABS: 1.0000 | ORAL_TABLET | Freq: Four times a day (QID) | ORAL | 0 refills | Status: DC | PRN

## 2016-09-17 MED ORDER — FENTANYL CITRATE (PF) 100 MCG/2ML IJ SOLN
INTRAMUSCULAR | Status: AC
  Filled 2016-09-17: qty 2

## 2016-09-17 MED ORDER — FENTANYL CITRATE (PF) 100 MCG/2ML IJ SOLN
25.0000 ug | INTRAMUSCULAR | Status: DC | PRN
  Administered 2016-09-17: 50 ug via INTRAVENOUS

## 2016-09-17 MED ORDER — GLYCOPYRROLATE 0.2 MG/ML IV SOSY
PREFILLED_SYRINGE | INTRAVENOUS | Status: DC | PRN
  Administered 2016-09-17: .2 mg via INTRAVENOUS

## 2016-09-17 MED ORDER — GLYCOPYRROLATE 0.2 MG/ML IV SOSY
PREFILLED_SYRINGE | INTRAVENOUS | Status: AC
  Filled 2016-09-17: qty 3

## 2016-09-17 MED ORDER — CEFAZOLIN SODIUM-DEXTROSE 2-4 GM/100ML-% IV SOLN
INTRAVENOUS | Status: AC
  Filled 2016-09-17: qty 100

## 2016-09-17 MED ORDER — ONDANSETRON HCL 4 MG/2ML IJ SOLN
INTRAMUSCULAR | Status: AC
  Filled 2016-09-17: qty 2

## 2016-09-17 MED ORDER — SCOPOLAMINE 1 MG/3DAYS TD PT72: 1.0000 | MEDICATED_PATCH | Freq: Once | TRANSDERMAL | Status: DC | PRN

## 2016-09-17 MED ORDER — GABAPENTIN 300 MG PO CAPS
ORAL_CAPSULE | ORAL | Status: AC
  Filled 2016-09-17: qty 1

## 2016-09-17 MED ORDER — BUPIVACAINE HCL (PF) 0.5 % IJ SOLN
INTRAMUSCULAR | Status: AC
  Filled 2016-09-17: qty 30

## 2016-09-17 MED ORDER — LACTATED RINGERS IV SOLN
INTRAVENOUS | Status: DC
  Administered 2016-09-17: 07:00:00 via INTRAVENOUS

## 2016-09-17 MED ORDER — CEFAZOLIN SODIUM-DEXTROSE 2-4 GM/100ML-% IV SOLN: 2.0000 g | INTRAVENOUS | Status: DC

## 2016-09-17 MED ORDER — MIDAZOLAM HCL 2 MG/2ML IJ SOLN
1.0000 mg | INTRAMUSCULAR | Status: DC | PRN
  Administered 2016-09-17: 2 mg via INTRAVENOUS

## 2016-09-17 MED ORDER — MIDAZOLAM HCL 2 MG/2ML IJ SOLN
INTRAMUSCULAR | Status: AC
  Filled 2016-09-17: qty 2

## 2016-09-17 MED ORDER — PROMETHAZINE HCL 25 MG/ML IJ SOLN: 6.2500 mg | INTRAMUSCULAR | Status: DC | PRN

## 2016-09-17 MED ORDER — ONDANSETRON HCL 4 MG/2ML IJ SOLN
INTRAMUSCULAR | Status: DC | PRN
  Administered 2016-09-17: 4 mg via INTRAVENOUS

## 2016-09-17 MED ORDER — 0.9 % SODIUM CHLORIDE (POUR BTL) OPTIME
TOPICAL | Status: DC | PRN
  Administered 2016-09-17: 600 mL

## 2016-09-17 MED ORDER — DEXAMETHASONE SODIUM PHOSPHATE 10 MG/ML IJ SOLN
INTRAMUSCULAR | Status: AC
  Filled 2016-09-17: qty 1

## 2016-09-17 MED ORDER — LIDOCAINE 2% (20 MG/ML) 5 ML SYRINGE
INTRAMUSCULAR | Status: AC
  Filled 2016-09-17: qty 5

## 2016-09-17 MED ORDER — LIDOCAINE 2% (20 MG/ML) 5 ML SYRINGE
INTRAMUSCULAR | Status: DC | PRN
  Administered 2016-09-17: 80 mg via INTRAVENOUS

## 2016-09-17 MED ORDER — CELECOXIB 400 MG PO CAPS
400.0000 mg | ORAL_CAPSULE | ORAL | Status: AC
  Administered 2016-09-17: 400 mg via ORAL

## 2016-09-17 SURGICAL SUPPLY — 64 items
ADH SKN CLS APL DERMABOND .7 (GAUZE/BANDAGES/DRESSINGS) ×1
APL SKNCLS STERI-STRIP NONHPOA (GAUZE/BANDAGES/DRESSINGS)
APPLIER CLIP 9.375 MED OPEN (MISCELLANEOUS)
APR CLP MED 9.3 20 MLT OPN (MISCELLANEOUS)
BENZOIN TINCTURE PRP APPL 2/3 (GAUZE/BANDAGES/DRESSINGS) IMPLANT
BINDER BREAST LRG (GAUZE/BANDAGES/DRESSINGS) IMPLANT
BINDER BREAST MEDIUM (GAUZE/BANDAGES/DRESSINGS) IMPLANT
BINDER BREAST XLRG (GAUZE/BANDAGES/DRESSINGS) ×2 IMPLANT
BINDER BREAST XXLRG (GAUZE/BANDAGES/DRESSINGS) IMPLANT
BLADE HEX COATED 2.75 (ELECTRODE) ×3 IMPLANT
BLADE SURG 10 STRL SS (BLADE) IMPLANT
BLADE SURG 15 STRL LF DISP TIS (BLADE) ×1 IMPLANT
BLADE SURG 15 STRL SS (BLADE) ×3
CANISTER SUC SOCK COL 7IN (MISCELLANEOUS) IMPLANT
CANISTER SUCT 1200ML W/VALVE (MISCELLANEOUS) ×3 IMPLANT
CHLORAPREP W/TINT 26ML (MISCELLANEOUS) ×3 IMPLANT
CLIP APPLIE 9.375 MED OPEN (MISCELLANEOUS) IMPLANT
CLOSURE WOUND 1/2 X4 (GAUZE/BANDAGES/DRESSINGS)
COVER BACK TABLE 60X90IN (DRAPES) ×3 IMPLANT
COVER MAYO STAND STRL (DRAPES) ×3 IMPLANT
COVER PROBE W GEL 5X96 (DRAPES) ×3 IMPLANT
DECANTER SPIKE VIAL GLASS SM (MISCELLANEOUS) IMPLANT
DERMABOND ADVANCED (GAUZE/BANDAGES/DRESSINGS) ×2
DERMABOND ADVANCED .7 DNX12 (GAUZE/BANDAGES/DRESSINGS) ×1 IMPLANT
DEVICE DUBIN W/COMP PLATE 8390 (MISCELLANEOUS) ×3 IMPLANT
DRAPE LAPAROSCOPIC ABDOMINAL (DRAPES) ×3 IMPLANT
DRAPE UTILITY XL STRL (DRAPES) ×3 IMPLANT
DRSG PAD ABDOMINAL 8X10 ST (GAUZE/BANDAGES/DRESSINGS) ×2 IMPLANT
ELECT REM PT RETURN 9FT ADLT (ELECTROSURGICAL) ×3
ELECTRODE REM PT RTRN 9FT ADLT (ELECTROSURGICAL) ×1 IMPLANT
GLOVE EUDERMIC 7 POWDERFREE (GLOVE) ×3 IMPLANT
GOWN STRL REUS W/ TWL LRG LVL3 (GOWN DISPOSABLE) ×1 IMPLANT
GOWN STRL REUS W/ TWL XL LVL3 (GOWN DISPOSABLE) ×1 IMPLANT
GOWN STRL REUS W/TWL 2XL LVL3 (GOWN DISPOSABLE) ×2 IMPLANT
GOWN STRL REUS W/TWL LRG LVL3 (GOWN DISPOSABLE)
GOWN STRL REUS W/TWL XL LVL3 (GOWN DISPOSABLE) ×3
ILLUMINATOR WAVEGUIDE N/F (MISCELLANEOUS) IMPLANT
KIT MARKER MARGIN INK (KITS) ×3 IMPLANT
LIGHT WAVEGUIDE WIDE FLAT (MISCELLANEOUS) IMPLANT
NDL HYPO 25X1 1.5 SAFETY (NEEDLE) ×1 IMPLANT
NEEDLE HYPO 25X1 1.5 SAFETY (NEEDLE) ×3 IMPLANT
NS IRRIG 1000ML POUR BTL (IV SOLUTION) ×3 IMPLANT
PACK BASIN DAY SURGERY FS (CUSTOM PROCEDURE TRAY) ×3 IMPLANT
PENCIL BUTTON HOLSTER BLD 10FT (ELECTRODE) ×3 IMPLANT
SHEET MEDIUM DRAPE 40X70 STRL (DRAPES) ×2 IMPLANT
SLEEVE SCD COMPRESS KNEE MED (MISCELLANEOUS) ×3 IMPLANT
SPONGE GAUZE 4X4 12PLY STER LF (GAUZE/BANDAGES/DRESSINGS) ×2 IMPLANT
SPONGE LAP 18X18 X RAY DECT (DISPOSABLE) ×2 IMPLANT
SPONGE LAP 4X18 X RAY DECT (DISPOSABLE) ×7 IMPLANT
STRIP CLOSURE SKIN 1/2X4 (GAUZE/BANDAGES/DRESSINGS) IMPLANT
SUT ETHILON 3 0 FSL (SUTURE) IMPLANT
SUT MNCRL AB 4-0 PS2 18 (SUTURE) ×3 IMPLANT
SUT SILK 2 0 SH (SUTURE) ×3 IMPLANT
SUT VIC AB 2-0 CT1 27 (SUTURE)
SUT VIC AB 2-0 CT1 TAPERPNT 27 (SUTURE) IMPLANT
SUT VIC AB 3-0 SH 27 (SUTURE) ×3
SUT VIC AB 3-0 SH 27X BRD (SUTURE) IMPLANT
SUT VICRYL 3-0 CR8 SH (SUTURE) ×3 IMPLANT
SYR 10ML LL (SYRINGE) ×3 IMPLANT
TOWEL OR 17X24 6PK STRL BLUE (TOWEL DISPOSABLE) ×3 IMPLANT
TOWEL OR NON WOVEN STRL DISP B (DISPOSABLE) IMPLANT
TUBE CONNECTING 20'X1/4 (TUBING) ×1
TUBE CONNECTING 20X1/4 (TUBING) ×2 IMPLANT
YANKAUER SUCT BULB TIP NO VENT (SUCTIONS) ×3 IMPLANT

## 2016-09-17 NOTE — Op Note (Signed)
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 Dalbert Batman, M.D., FACS  Assistant:                      OR staff   Indication for Assistant: N/A  Operative Indications:    This is a pleasant, 60 year old Caucasian female, referred by Dr. Ammie Ferrier at the breast center where his workup for evaluation complex sclerosing lesion left breast, 12:30 position.       She gets annual screening mammograms. She  noticed some pain in the central and upper left breast for about a month and a half but no nipple discharge or skin changes. Mammograms and ultrasound showed a small area of distortion in the left breast at the 12 to 12:30 position. There was also a benign-appearing mass in the left breast at the 11:30 position. The left axilla was normal by ultrasound. The right breast normal.      Both lesions were biopsied. 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 In the upper outer quadrant, probably about 12:30 there was a complex sclerosing lesion.      Past history reveals no breast problems. Borderline hypertension. TAH and BSO through Pfannenstiel incision for benign disease. Family history is negative for breast, ovarian, colon, or pancreatic cancer. Mother living has lung cancer and COPD. Father died had lung cancer.      We had a very lengthy discussion about the statistics surrounding this lesion. She knows that our literature is variable and shows anywhere from 2-9% chance of upgrading this to atypia or in situ cancer. She knows that most likely this is not cancer. I offered lumpectomy with radioactive seed localization to confirm the diagnosis. We also talked about close clinical follow-up 6 months  imaging and exam. After a lengthy discussion she decided that she wanted to go ahead and schedule for left breast lumpectomy with radioactive seed localization. hs  Operative Findings:       The radioactive seed was in the left breast at the 12:30 position.  I was able to make a circumareolar incision, hidden scar, at the areolar margin.  The specimen mammogram looked good with the seed and the original biopsy marker clip in the center of the specimen.  There was no palpable abnormality.  Procedure in Detail:          Following the induction of general LMA anesthesia the patient's left breast was prepped and draped in a sterile fashion.  Surgical timeout was performed.  Intravenous antibiotics were given.  0.5% Marcaine was used as local infiltration anesthetic.  I used the neoprobe to plan my incision.  I made a curvilinear incision at the areolar margin superiorly.  Dissection was carried down into the breast tissue and around the radioactive signal using the neoprobe frequently.  The specimen was removed, marked with silk sutures and a 6 color ink kit to orient the pathologist.  The specimen mammogram  looked good as described above.  The specimen went to the lab where the seed was retrieved.  Hemostasis was excellent and achieved with  electrocautery.  The wound was irrigated with saline and the lumpectomy cavity was marked with 5 metal clips at the cardinal positions.  The breast tissue was closed in 2 separate layers with interrupted 3-0 Vicryl and the skin closed with a running subcuticular 4-0 Monocryl and Dermabond.  Breast binder was placed in the patient taken to PACU in stable condition.  EBL 20 mL.  Counts correct.  Complications none.      Edsel Petrin. Dalbert Batman, M.D., FACS General and Minimally Invasive Surgery Breast and Colorectal Surgery  09/17/2016 8:29 AM

## 2016-09-17 NOTE — Anesthesia Postprocedure Evaluation (Signed)
Anesthesia Post Note  Patient: Cassandra Hicks  Procedure(s) Performed: Procedure(s) (LRB): LEFT BREAST LUMPECTOMY WITH RADIOACTIVE SEED LOCALIZATION (Left)  Patient location during evaluation: PACU Anesthesia Type: General Level of consciousness: awake and alert Pain management: pain level controlled Vital Signs Assessment: post-procedure vital signs reviewed and stable Respiratory status: spontaneous breathing, nonlabored ventilation, respiratory function stable and patient connected to nasal cannula oxygen Cardiovascular status: blood pressure returned to baseline and stable Postop Assessment: no signs of nausea or vomiting Anesthetic complications: no    Last Vitals:  Vitals:   09/17/16 0900 09/17/16 0915  BP: 128/85 120/88  Pulse: 76 77  Resp: 15 16  Temp:  36.7 C    Last Pain:  Vitals:   09/17/16 0915  TempSrc:   PainSc: [name]

## 2016-09-17 NOTE — Anesthesia Procedure Notes (Signed)
Procedure Name: LMA Insertion Date/Time: 09/17/2016 7:32 AM Performed by: Lyndee Leo Pre-anesthesia Checklist: Patient identified, Emergency Drugs available, Suction available and Patient being monitored Patient Re-evaluated:Patient Re-evaluated prior to inductionOxygen Delivery Method: Circle system utilized Preoxygenation: Pre-oxygenation with 100% oxygen Intubation Type: IV induction Ventilation: Mask ventilation without difficulty LMA: LMA inserted LMA Size: 4.0 Number of attempts: 1 Airway Equipment and Method: Bite block Placement Confirmation: positive ETCO2 Tube secured with: Tape Dental Injury: Teeth and Oropharynx as per pre-operative assessment

## 2016-09-17 NOTE — Interval H&P Note (Signed)
History and Physical Interval Note:  09/17/2016 7:04 AM  Cassandra Hicks  has presented today for surgery, with the diagnosis of LEFT BREAST COMPLEX SCLEROSING LESION  The various methods of treatment have been discussed with the patient and family. After consideration of risks, benefits and other options for treatment, the patient has consented to  Procedure(s): LEFT BREAST LUMPECTOMY WITH RADIOACTIVE SEED LOCALIZATION (Left) as a surgical intervention .  The patient's history has been reviewed, patient examined, no change in status, stable for surgery.  I have reviewed the patient's chart and labs.  Questions were answered to the patient's satisfaction.     Adin Hector

## 2016-09-17 NOTE — Transfer of Care (Signed)
Immediate Anesthesia Transfer of Care Note  Patient: Cassandra Hicks  Procedure(s) Performed: Procedure(s): LEFT BREAST LUMPECTOMY WITH RADIOACTIVE SEED LOCALIZATION (Left)  Patient Location: PACU  Anesthesia Type:General  Level of Consciousness: awake, sedated and patient cooperative  Airway & Oxygen Therapy: Patient Spontanous Breathing and Patient connected to face mask oxygen  Post-op Assessment: Report given to RN and Post -op Vital signs reviewed and stable  Post vital signs: Reviewed and stable  Last Vitals:  Vitals:   09/17/16 0628  BP: 136/77  Pulse: 67  Resp: 18  Temp: 36.5 C    Last Pain:  Vitals:   09/17/16 0628  TempSrc: Oral      Patients Stated Pain Goal: 0 ([id number])  Complications: No apparent anesthesia complications

## 2016-09-17 NOTE — Discharge Instructions (Signed)
[facility] °Office Phone Number [phone] ° °BREAST BIOPSY/ LUMPECTOMY  POST OP INSTRUCTIONS ° °Always review your discharge instruction sheet given to you by the facility where your surgery was performed. ° °IF YOU HAVE DISABILITY OR FAMILY LEAVE FORMS, YOU MUST BRING THEM TO THE OFFICE FOR PROCESSING.  DO NOT GIVE THEM TO YOUR DOCTOR. ° °1. A prescription for pain medication may be given to you upon discharge.  Take your pain medication as prescribed, if needed.  If narcotic pain medicine is not needed, then you may take acetaminophen (Tylenol) or ibuprofen (Advil) as needed. °2. Take your usually prescribed medications unless otherwise directed °3. If you need a refill on your pain medication, please contact your pharmacy.  They will contact our office to request authorization.  Prescriptions will not be filled after 5pm or on week-ends. °4. You should eat very light the first 24 hours after surgery, such as soup, crackers, pudding, etc.  Resume your normal diet the day after surgery. °5. Most patients will experience some swelling and bruising in the breast.  Ice packs and a good support bra will help.  Swelling and bruising can take several days to resolve.  °6. It is common to experience some constipation if taking pain medication after surgery.  Increasing fluid intake and taking a stool softener will usually help or prevent this problem from occurring.  A mild laxative (Milk of Magnesia or Miralax) should be taken according to package directions if there are no bowel movements after 48 hours. °7. Unless discharge instructions indicate otherwise, you may remove your bandages 24-48 hours after surgery, and you may shower at that time.  You may have steri-strips (small skin tapes) in place directly over the incision.  These strips should be left on the skin for 7-10 days.  If your surgeon used skin glue on the incision, you may shower in 24 hours.  The glue will flake off over the next 2-3  weeks.  Any sutures or staples will be removed at the office during your follow-up visit. °8. ACTIVITIES:  You may resume regular daily activities (gradually increasing) beginning the next day.  Wearing a good support bra or sports bra minimizes pain and swelling.  You may have sexual intercourse when it is comfortable. °a. You may drive when you no longer are taking prescription pain medication, you can comfortably wear a seatbelt, and you can safely maneuver your car and apply brakes. °b. RETURN TO WORK:  ______________________________________________________________________________________ °9. You should see your doctor in the office for a follow-up appointment approximately two weeks after your surgery.  Your doctor’s nurse will typically make your follow-up appointment when she calls you with your pathology report.  Expect your pathology report 2-3 business days after your surgery.  You may call to check if you do not hear from us after three days. ° ° °WHEN TO CALL YOUR DOCTOR: °1. Fever over 101.0 °2. Nausea and/or vomiting. °3. Extreme swelling or bruising. °4. Continued bleeding from incision. °5. Increased pain, redness, or drainage from the incision. ° °The clinic staff is available to answer your questions during regular business hours.  Please don’t hesitate to call and ask to speak to one of the nurses for clinical concerns.  If you have a medical emergency, go to the nearest emergency room or call 911.  A surgeon from [facility] is always on call at the hospital. ° °For further questions, please visit [email or web address]  ° ° ° °Post Anesthesia Home Care   Instructions ° °Activity: °Get plenty of rest for the remainder of the day. A responsible adult should stay with you for 24 hours following the procedure.  °For the next 24 hours, DO NOT: °-Drive a car °-Operate machinery °-Drink alcoholic beverages °-Take any medication unless instructed by your physician °-Make any legal  decisions or sign important papers. ° °Meals: °Start with liquid foods such as gelatin or soup. Progress to regular foods as tolerated. Avoid greasy, spicy, heavy foods. If nausea and/or vomiting occur, drink only clear liquids until the nausea and/or vomiting subsides. Call your physician if vomiting continues. ° °Special Instructions/Symptoms: °Your throat may feel dry or sore from the anesthesia or the breathing tube placed in your throat during surgery. If this causes discomfort, gargle with warm salt water. The discomfort should disappear within 24 hours. ° °If you had a scopolamine patch placed behind your ear for the management of post- operative nausea and/or vomiting: ° °1. The medication in the patch is effective for 72 hours, after which it should be removed.  Wrap patch in a tissue and discard in the trash. Wash hands thoroughly with soap and water. °2. You may remove the patch earlier than 72 hours if you experience unpleasant side effects which may include dry mouth, dizziness or visual disturbances. °3. Avoid touching the patch. Wash your hands with soap and water after contact with the patch. °  ° ° ° °

## 2016-09-20 ENCOUNTER — Encounter (HOSPITAL_BASED_OUTPATIENT_CLINIC_OR_DEPARTMENT_OTHER): Payer: Self-pay | Admitting: General Surgery

## 2016-09-20 ENCOUNTER — Emergency Department (HOSPITAL_COMMUNITY): Payer: BC Managed Care – PPO

## 2016-09-20 ENCOUNTER — Emergency Department (HOSPITAL_COMMUNITY)
Admission: EM | Admit: 2016-09-20 | Discharge: 2016-09-20 | Disposition: A | Discharge status: Home / Self Care | Payer: BC Managed Care – PPO | Attending: Emergency Medicine | Admitting: Emergency Medicine

## 2016-09-20 DIAGNOSIS — I10 Essential (primary) hypertension: Secondary | ICD-10-CM | POA: Diagnosis not present

## 2016-09-20 DIAGNOSIS — Z9889 Other specified postprocedural states: Secondary | ICD-10-CM | POA: Insufficient documentation

## 2016-09-20 DIAGNOSIS — R42 Dizziness and giddiness: Secondary | ICD-10-CM | POA: Diagnosis not present

## 2016-09-20 DIAGNOSIS — Z87891 Personal history of nicotine dependence: Secondary | ICD-10-CM | POA: Insufficient documentation

## 2016-09-20 DIAGNOSIS — R0602 Shortness of breath: Secondary | ICD-10-CM

## 2016-09-20 LAB — CBC
HCT: 41.8 % (ref 36.0–46.0)
HEMOGLOBIN: 14.9 g/dL (ref 12.0–15.0)
MCH: 32 pg (ref 26.0–34.0)
MCHC: 35.6 g/dL (ref 30.0–36.0)
MCV: 89.7 fL (ref 78.0–100.0)
PLATELETS: 259 10*3/uL (ref 150–400)
RBC: 4.66 MIL/uL (ref 3.87–5.11)
RDW: 12.6 % (ref 11.5–15.5)
WBC: 10.7 10*3/uL — AB (ref 4.0–10.5)

## 2016-09-20 LAB — I-STAT TROPONIN, ED: Troponin i, poc: 0 ng/mL (ref 0.00–0.08)

## 2016-09-20 LAB — BASIC METABOLIC PANEL
ANION GAP: 9 (ref 5–15)
BUN: 19 mg/dL (ref 6–20)
CHLORIDE: 103 mmol/L (ref 101–111)
CO2: 29 mmol/L (ref 22–32)
Calcium: 9.8 mg/dL (ref 8.9–10.3)
Creatinine, Ser: 0.87 mg/dL (ref 0.44–1.00)
GFR calc non Af Amer: >60 mL/min (ref >60)
Glucose, Bld: 95 mg/dL (ref 65–99)
POTASSIUM: 3.6 mmol/L (ref 3.5–5.1)
SODIUM: 141 mmol/L (ref 135–145)

## 2016-09-20 LAB — D-DIMER, QUANTITATIVE (NOT AT ARMC): D DIMER QUANT: 0.39 ug{FEU}/mL (ref 0.00–0.50)

## 2016-09-20 NOTE — Progress Notes (Signed)
Inform patient of Pathology report,.  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  No atypia and no cancer.  Tell her I will discuss this with her in detail at the next office visit  hmi

## 2016-09-20 NOTE — ED Triage Notes (Signed)
Pt complains of chest pain, shortness of breath, dizziness since coming out of anesthesia for a lumpectomy on Friday. Pt states she has had trouble staying awake since the surgery.

## 2016-09-20 NOTE — ED Notes (Signed)
Blue tube sent to lab 

## 2016-09-20 NOTE — ED Notes (Signed)
Patient ambulatory and independent at discharge.  Verbalized understanding of discharge instructions. 

## 2016-09-20 NOTE — ED Notes (Signed)
Gave report to Lilibeth, RN.  

## 2016-09-20 NOTE — ED Provider Notes (Signed)
[facility] DEPT Provider Note   CSN: [id number] Arrival date & time: 09/20/16  1503     History   Chief Complaint Chief Complaint  Patient presents with  . Shortness of Breath  . Dizziness  . Chest Pain    HPI Cassandra Hicks is a 60 y.o. female.  HPI Patient had left breast lumpectomy 3 days ago. Patient reports the day after her procedure she felt very fatigued and somewhat dizzy. She attributed to the anesthesia medications. She reports that today she had an episode of chest discomfort which she actually attributes to being stressed at work. It was more a pressure discomfort in her left upper chest. She reports her breast is also sore at the biopsy site. Yesterday she notes feeling short of breath, but less so today. She got a follow-up call from the surgical center today and reported some of her symptoms. She was advised to come to emergency department for further assessment. Patient denies she's had any calf swelling or pain. She reports at this time symptoms are gone, although her left breast is still somewhat sore.  Family history is positive for 2 sisters who have experienced DVT. Past Medical History:  Diagnosis Date  . Abnormal mammogram of left breast 09/17/2016  . Anxiety   . Breast mass, left   . GERD (gastroesophageal reflux disease)    mild, no meds, diet controlled  . Heart murmur    as child, no problem  . Hypertension   . Missed ab    x 2  D&E  . SVD (spontaneous vaginal delivery)    x 2    Patient Active Problem List   Diagnosis Date Noted  . Abnormal mammogram of left breast 09/17/2016  . Ovarian cyst 12/09/2014  . HTN (hypertension) 12/08/2011    Past Surgical History:  Procedure Laterality Date  . ABDOMINAL HYSTERECTOMY N/A 12/09/2014   Procedure: HYSTERECTOMY ABDOMINAL;  Surgeon: Margarette Asal, MD;  Location: [facility];  Service: Gynecology;  Laterality: N/A;  . APPENDECTOMY    . BREAST LUMPECTOMY WITH RADIOACTIVE SEED LOCALIZATION Left  09/17/2016   Procedure: LEFT BREAST LUMPECTOMY WITH RADIOACTIVE SEED LOCALIZATION;  Surgeon: Fanny Skates, MD;  Location: [facility];  Service: General;  Laterality: Left;  . COLONOSCOPY    . DIAGNOSTIC LAPAROSCOPY    . DILATION AND CURETTAGE OF UTERUS     x 2 for MAB  . ECTOPIC PREGNANCY SURGERY     x 2  . EYE SURGERY     bilateral - Radial Keratotomy   . SALPINGOOPHORECTOMY Bilateral 12/09/2014   Procedure: SALPINGO OOPHORECTOMY;  Surgeon: Margarette Asal, MD;  Location: [facility];  Service: Gynecology;  Laterality: Bilateral;  . TONSILLECTOMY    . TUBAL LIGATION    . WISDOM TOOTH EXTRACTION      OB History    No data available       Home Medications    Prior to Admission medications   Medication Sig Start Date End Date Taking? Authorizing Provider  hydrochlorothiazide (HYDRODIURIL) 25 MG tablet Take 1 tablet (25 mg total) by mouth daily. 06/05/15  Yes Gay Filler Copland, MD  ibuprofen (ADVIL,MOTRIN) 200 MG tablet Take 400 mg by mouth every 6 (six) hours as needed (pain).   Yes Historical Provider, MD  metoprolol succinate (TOPROL-XL) 50 MG 24 hr tablet Take 1 daily for hypertension 06/05/15  Yes Gay Filler Copland, MD  HYDROcodone-acetaminophen (NORCO) 5-325 MG tablet Take 1-2 tablets by mouth every 6 (six) hours as  needed for moderate pain or severe pain. 09/17/16   Fanny Skates, MD    Family History Family History  Problem Relation Age of Onset  . COPD Mother   . Cancer Father     lung  . Hypertension Father   . Hypertension Sister   . Hypertension Brother     Social History Social History  Substance Use Topics  . Smoking status: Former Smoker    Packs/day: 1.00    Years: 30.00    Types: Cigarettes    Quit date: 12/07/1996  . Smokeless tobacco: Never Used  . Alcohol use 7.2 oz/week    12 Cans of beer per week     Comment: social/weekends     Allergies   Penicillins   Review of Systems Review of Systems  10 Systems reviewed and are  negative for acute change except as noted in the HPI.  Physical Exam Updated Vital Signs BP 110/77 (BP Location: Right Arm)   Pulse 62   Temp 97.8 F (36.6 C) (Oral)   Resp 18   SpO2 97%   Physical Exam  Constitutional: She appears well-developed and well-nourished. No distress.  HENT:  Head: Normocephalic and atraumatic.  Eyes: Conjunctivae are normal.  Neck: Neck supple.  Cardiovascular: Normal rate and regular rhythm.   No murmur heard. Pulmonary/Chest: Effort normal and breath sounds normal. No respiratory distress.  Patient has a postoperative incision superior to the left areola. This is clean dry and intact. No erythema or significant swelling or induration of the surrounding breast tissue.  Abdominal: Soft. She exhibits no distension. There is no tenderness.  Musculoskeletal: She exhibits no edema, tenderness or deformity.  Patient has no calf or popliteal fossa tenderness. No peripheral edema.  Neurological: She is alert. She exhibits normal muscle tone. Coordination normal.  Skin: Skin is warm and dry.  Psychiatric: She has a normal mood and affect.  Nursing note and vitals reviewed.    ED Treatments / Results  Labs (all labs ordered are listed, but only abnormal results are displayed) Labs Reviewed  CBC - Abnormal; Notable for the following:       Result Value   WBC 10.7 (*)    All other components within normal limits  BASIC METABOLIC PANEL  D-DIMER, QUANTITATIVE (NOT AT [facility])  Randolm Idol, ED    EKG  EKG Interpretation  Date/Time:  Monday September 20 2016 15:19:59 EST Ventricular Rate:  71 PR Interval:    QRS Duration: 95 QT Interval:  408 QTC Calculation: 444 R Axis:   25 Text Interpretation:  Sinus rhythm Low voltage, precordial leads Baseline wander in lead(s) V2 normal no change from old. Confirmed by Johnney Killian, [place], Jeannie Done [phone]) on 09/20/2016 6:00:05 PM       Radiology Dg Chest 2 View  Result Date: 09/20/2016 CLINICAL DATA:  Chest  pain, shortness of breath, dizziness status post left breast lumpectomy on Friday EXAM: CHEST  2 VIEW COMPARISON:  None. FINDINGS: Lungs are clear.  No pleural effusion or pneumothorax. The heart is normal in size. Mild degenerative changes of the visualized thoracolumbar spine. Postsurgical changes involving the left breast. IMPRESSION: No evidence of acute cardiopulmonary disease. Electronically Signed   By: Julian Hy M.D.   On: 09/20/2016 16:27    Procedures Procedures (including critical care time)  Medications Ordered in ED Medications - No data to display   Initial Impression / Assessment and Plan / ED Course  I have reviewed the triage vital signs and the nursing notes.  Pertinent labs & imaging results that were available during my care of the patient were reviewed by me and considered in my medical decision making (see chart for details).  Clinical Course     Final Clinical Impressions(s) / ED Diagnoses   Final diagnoses:  Dizziness  Shortness of breath  Status post biopsy  Patient experienced symptoms yesterday of dizziness as well as shortness of breath. With the patient's recent surgical history, positive family history, concern was for possible DVT. D-dimer is not in the elevated range. Patient does not have any lower extremity tenderness or swelling. Patient's chest pain seemed to have quality more suggestive of soft tissue discomfort rather than pleurisy. At this time, patient is stable with stable vital signs and a hypoxia. She is counseled on signs and symptoms for which to return and follow-up with her surgeon.  New Prescriptions Discharge Medication List as of 09/20/2016  7:49 PM       Charlesetta Shanks, MD 09/20/16 2014

## 2016-10-18 ENCOUNTER — Other Ambulatory Visit: Payer: Self-pay | Admitting: Family Medicine

## 2016-10-18 DIAGNOSIS — I10 Essential (primary) hypertension: Secondary | ICD-10-CM

## 2016-11-11 ENCOUNTER — Ambulatory Visit (INDEPENDENT_AMBULATORY_CARE_PROVIDER_SITE_OTHER): Payer: Commercial Managed Care - HMO | Admitting: Family Medicine

## 2016-11-11 DIAGNOSIS — I10 Essential (primary) hypertension: Secondary | ICD-10-CM

## 2016-11-11 MED ORDER — METOPROLOL SUCCINATE ER 50 MG PO TB24: 50.0000 mg | ORAL_TABLET | Freq: Every day | ORAL | 2 refills | Status: DC

## 2016-11-11 MED ORDER — HYDROCHLOROTHIAZIDE 25 MG PO TABS: 25.0000 mg | ORAL_TABLET | Freq: Every day | ORAL | 2 refills | Status: DC

## 2016-11-11 NOTE — Patient Instructions (Addendum)
It was good to meet you today  I have refilled your two blood pressure medicines for you.  If you have any lightheadedness or dizziness, come back to see [place] because this might mean your blood pressure is too low.    Hypertension Hypertension is another name for high blood pressure. High blood pressure forces your heart to work harder to pump blood. A blood pressure reading has two numbers, which includes a higher number over a lower number (example: 110/72). Follow these instructions at home:  Have your blood pressure rechecked by your doctor.  Only take medicine as told by your doctor. Follow the directions carefully. The medicine does not work as well if you skip doses. Skipping doses also puts you at risk for problems.  Do not smoke.  Monitor your blood pressure at home as told by your doctor. Contact a doctor if:  You think you are having a reaction to the medicine you are taking.  You have repeat headaches or feel dizzy.  You have puffiness (swelling) in your ankles.  You have trouble with your vision. Get help right away if:  You get a very bad headache and are confused.  You feel weak, numb, or faint.  You get chest or belly (abdominal) pain.  You throw up (vomit).  You cannot breathe very well. This information is not intended to replace advice given to you by your health care provider. Make sure you discuss any questions you have with your health care provider. Document Released: 04/12/2008 Document Revised: 04/01/2016 Document Reviewed: 08/17/2013 Elsevier Interactive Patient Education  2017 [employer].     IF you received an x-ray today, you will receive an invoice from [facility]. Please contact [facility] at [phone] with questions or concerns regarding your invoice.   IF you received labwork today, you will receive an invoice from [place] [facility]. Please contact [facility] at [phone] with questions or concerns regarding your invoice.    Our billing staff will not be able to assist you with questions regarding bills from these companies.  You will be contacted with the lab results as soon as they are available. The fastest way to get your results is to activate your My Chart account. Instructions are located on the last page of this paperwork. If you have not heard from [place] regarding the results in 2 weeks, please contact this office.

## 2016-11-11 NOTE — Progress Notes (Signed)
Cassandra Hicks is a 61 y.o. female who presents to [facility] today for HTN refills:  1.   Hypertension:  Long-term problem for this patient.  No adverse effects from medication.  Not checking it regularly.  No HA, CP, dizziness, shortness of breath, palpitations, or LE swelling.   BP Readings from Last 3 Encounters:  11/11/16 110/80  09/20/16 110/77  09/17/16 120/88    She has no other concerns.  She has upcoming colonoscopy in next 3 weeks.  Had recent lumpectomy.  Recent ED visit was for chest pain s/p lumpectomy (NOT dizziness as it states in chart).  No chest pain since then -- chest pain at that time was chest wall/surgical pain.  No dizziness or lightheadedness.  Has started exercise program.    ROS as above.    PMH reviewed. Patient is a nonsmoker.   Past Medical History:  Diagnosis Date  . Abnormal mammogram of left breast 09/17/2016  . Anxiety   . Breast mass, left   . GERD (gastroesophageal reflux disease)    mild, no meds, diet controlled  . Heart murmur    as child, no problem  . Hypertension   . Missed ab    x 2  D&E  . SVD (spontaneous vaginal delivery)    x 2   Past Surgical History:  Procedure Laterality Date  . ABDOMINAL HYSTERECTOMY N/A 12/09/2014   Procedure: HYSTERECTOMY ABDOMINAL;  Surgeon: Margarette Asal, MD;  Location: [facility];  Service: Gynecology;  Laterality: N/A;  . APPENDECTOMY    . BREAST LUMPECTOMY WITH RADIOACTIVE SEED LOCALIZATION Left 09/17/2016   Procedure: LEFT BREAST LUMPECTOMY WITH RADIOACTIVE SEED LOCALIZATION;  Surgeon: Fanny Skates, MD;  Location: [facility];  Service: General;  Laterality: Left;  . COLONOSCOPY    . DIAGNOSTIC LAPAROSCOPY    . DILATION AND CURETTAGE OF UTERUS     x 2 for MAB  . ECTOPIC PREGNANCY SURGERY     x 2  . EYE SURGERY     bilateral - Radial Keratotomy   . SALPINGOOPHORECTOMY Bilateral 12/09/2014   Procedure: SALPINGO OOPHORECTOMY;  Surgeon: Margarette Asal, MD;   Location: [facility];  Service: Gynecology;  Laterality: Bilateral;  . TONSILLECTOMY    . TUBAL LIGATION    . WISDOM TOOTH EXTRACTION      Medications reviewed. Current Outpatient Prescriptions  Medication Sig Dispense Refill  . hydrochlorothiazide (HYDRODIURIL) 25 MG tablet TAKE 1 TABLET BY MOUTH EVERY DAY 30 tablet 0  . metoprolol succinate (TOPROL-XL) 50 MG 24 hr tablet TAKE 1 TABLET BY MOUTH EVERY DAY FOR HYPERTENSION 30 tablet 0  . HYDROcodone-acetaminophen (NORCO) 5-325 MG tablet Take 1-2 tablets by mouth every 6 (six) hours as needed for moderate pain or severe pain. (Patient not taking: Reported on 11/11/2016) 30 tablet 0  . ibuprofen (ADVIL,MOTRIN) 200 MG tablet Take 400 mg by mouth every 6 (six) hours as needed (pain).     No current facility-administered medications for this visit.      Physical Exam:  BP 110/80   Pulse 81   Temp 98.2 F (36.8 C) (Oral)   Resp 16   Ht 5\' 3"  (1.6 m)   Wt 179 lb (81.2 kg)   SpO2 93%   BMI 31.71 kg/m  Gen:  Alert, cooperative patient who appears stated age in no acute distress.  Vital signs reviewed. HEENT: EOMI,  MMM Pulm:  Clear to auscultation bilaterally with good air movement.  No wheezes  or rales noted.   Cardiac:  Regular rate and rhythm without murmur auscultated.  Good S1/S2. Abd:  Soft/nondistended/nontender.  Good bowel sounds throughout all four quadrants.  No masses noted.  Exts: Non edematous BL  LE, warm and well perfused.   Assessment and Plan:  1.  HTN: - at goal - refills provided today - BP [id number] systolic range -- but denies any orthostatic symptoms.  Discussed these, if they occur can consider decreasing or cutting out one of her meds.    2.  Preventative health: - Pap smear and colonoscopy upcoming.  Otherwise up to date.

## 2017-07-27 ENCOUNTER — Other Ambulatory Visit: Payer: Self-pay | Admitting: Obstetrics and Gynecology

## 2017-07-27 DIAGNOSIS — Z1231 Encounter for screening mammogram for malignant neoplasm of breast: Secondary | ICD-10-CM

## 2017-08-08 ENCOUNTER — Ambulatory Visit: Payer: BC Managed Care – PPO

## 2017-08-08 ENCOUNTER — Ambulatory Visit
Admission: RE | Admit: 2017-08-08 | Discharge: 2017-08-08 | Disposition: A | Discharge status: Home / Self Care | Payer: 59 | Source: Ambulatory Visit | Attending: Obstetrics and Gynecology | Admitting: Obstetrics and Gynecology

## 2017-08-08 DIAGNOSIS — Z1231 Encounter for screening mammogram for malignant neoplasm of breast: Secondary | ICD-10-CM

## 2017-08-10 ENCOUNTER — Other Ambulatory Visit: Payer: Self-pay | Admitting: Obstetrics and Gynecology

## 2017-08-10 DIAGNOSIS — R928 Other abnormal and inconclusive findings on diagnostic imaging of breast: Secondary | ICD-10-CM

## 2017-08-16 ENCOUNTER — Ambulatory Visit
Admission: RE | Admit: 2017-08-16 | Discharge: 2017-08-16 | Disposition: A | Discharge status: Home / Self Care | Payer: 59 | Source: Ambulatory Visit | Attending: Obstetrics and Gynecology | Admitting: Obstetrics and Gynecology

## 2017-08-16 ENCOUNTER — Ambulatory Visit: Admission: RE | Admit: 2017-08-16 | Payer: 59 | Source: Ambulatory Visit

## 2017-08-16 DIAGNOSIS — R928 Other abnormal and inconclusive findings on diagnostic imaging of breast: Secondary | ICD-10-CM

## 2017-11-17 ENCOUNTER — Inpatient Hospital Stay
Admit: 2017-11-17 | Discharge: 2017-11-17 | Disposition: A | Discharge status: Home / Self Care | Payer: MEDICAID | Attending: Emergency Medicine

## 2017-11-17 ENCOUNTER — Emergency Department: Admit: 2017-11-17 | Payer: MEDICAID | Primary: Student in an Organized Health Care Education/Training Program

## 2017-11-17 DIAGNOSIS — R109 Unspecified abdominal pain: Secondary | ICD-10-CM

## 2017-11-17 LAB — CBC WITH AUTOMATED DIFF
ABS. BASOPHILS: 0 10*3/uL (ref 0.0–0.2)
ABS. EOSINOPHILS: 0.1 10*3/uL (ref 0.0–0.8)
ABS. IMM. GRANS.: 0 10*3/uL (ref 0.0–0.5)
ABS. LYMPHOCYTES: 1 10*3/uL (ref 0.5–4.6)
ABS. MONOCYTES: 0.8 10*3/uL (ref 0.1–1.3)
ABS. NEUTROPHILS: 7.2 10*3/uL (ref 1.7–8.2)
ABSOLUTE NRBC: 0 10*3/uL (ref 0.0–0.2)
BASOPHILS: 0 % (ref 0.0–2.0)
EOSINOPHILS: 1 % (ref 0.5–7.8)
HCT: 39.6 % (ref 35.8–46.3)
HGB: 13 g/dL (ref 11.7–15.4)
IMMATURE GRANULOCYTES: 0 % (ref 0.0–5.0)
LYMPHOCYTES: 11 % — ABNORMAL LOW (ref 13–44)
MCH: 30.6 PG (ref 26.1–32.9)
MCHC: 32.8 g/dL (ref 31.4–35.0)
MCV: 93.2 FL (ref 79.6–97.8)
MONOCYTES: 8 % (ref 4.0–12.0)
MPV: 10 FL (ref 9.4–12.3)
NEUTROPHILS: 79 % — ABNORMAL HIGH (ref 43–78)
PLATELET: 257 10*3/uL (ref 150–450)
RBC: 4.25 M/uL (ref 4.05–5.2)
RDW: 12.4 % (ref 11.9–14.6)
WBC: 9.1 10*3/uL (ref 4.3–11.1)

## 2017-11-17 LAB — METABOLIC PANEL, COMPREHENSIVE
A-G Ratio: 1.4 (ref 1.2–3.5)
ALT (SGPT): 30 U/L (ref 12–65)
AST (SGOT): 18 U/L (ref 15–37)
Albumin: 3.7 g/dL (ref 3.2–4.6)
Alk. phosphatase: 69 U/L (ref 50–136)
Anion gap: 5 mmol/L — ABNORMAL LOW (ref 7–16)
BUN: 9 MG/DL (ref 8–23)
Bilirubin, total: 0.3 MG/DL (ref 0.2–1.1)
CO2: 32 mmol/L (ref 21–32)
Calcium: 9 MG/DL (ref 8.3–10.4)
Chloride: 100 mmol/L (ref 98–107)
Creatinine: 0.64 MG/DL (ref 0.6–1.0)
GFR est AA: >60 mL/min/{1.73_m2} (ref >60)
GFR est non-AA: >60 mL/min/{1.73_m2} (ref >60)
Globulin: 2.7 g/dL (ref 2.3–3.5)
Glucose: 84 mg/dL (ref 65–100)
Potassium: 3.5 mmol/L (ref 3.5–5.1)
Protein, total: 6.4 g/dL (ref 6.3–8.2)
Sodium: 137 mmol/L (ref 136–145)

## 2017-11-17 MED ORDER — LEVOFLOXACIN 750 MG TAB
750 mg | ORAL_TABLET | Freq: Every day | ORAL | 0 refills | Status: AC
Start: 2017-11-17 — End: ?

## 2017-11-17 MED ORDER — SODIUM CHLORIDE 0.9% BOLUS IV
0.9 % | Freq: Once | INTRAVENOUS | Status: AC
Start: 2017-11-17 — End: 2017-11-17
  Administered 2017-11-17: 16:00:00 via INTRAVENOUS

## 2017-11-17 MED ORDER — KETOROLAC TROMETHAMINE 30 MG/ML INJECTION
30 mg/mL (1 mL) | INTRAMUSCULAR | Status: AC
Start: 2017-11-17 — End: 2017-11-17
  Administered 2017-11-17: 18:00:00 via INTRAVENOUS

## 2017-11-17 MED ORDER — HYDROMORPHONE (PF) 1 MG/ML IJ SOLN
1 mg/mL | INTRAMUSCULAR | Status: AC
Start: 2017-11-17 — End: 2017-11-17
  Administered 2017-11-17: 16:00:00 via INTRAVENOUS

## 2017-11-17 MED ORDER — ACETAMINOPHEN 500 MG TAB
500 mg | ORAL | Status: AC
Start: 2017-11-17 — End: 2017-11-17
  Administered 2017-11-17: 18:00:00 via ORAL

## 2017-11-17 MED ORDER — ONDANSETRON (PF) 4 MG/2 ML INJECTION
4 mg/2 mL | INTRAMUSCULAR | Status: AC
Start: 2017-11-17 — End: 2017-11-17
  Administered 2017-11-17: 16:00:00 via INTRAVENOUS

## 2017-11-17 MED FILL — HYDROMORPHONE (PF) 1 MG/ML IJ SOLN: 1 mg/mL | INTRAMUSCULAR | Qty: 1

## 2017-11-17 MED FILL — ONDANSETRON (PF) 4 MG/2 ML INJECTION: 4 mg/2 mL | INTRAMUSCULAR | Qty: 2

## 2017-11-17 MED FILL — MAPAP EXTRA STRENGTH 500 MG TABLET: 500 mg | ORAL | Qty: 2

## 2017-11-17 MED FILL — KETOROLAC TROMETHAMINE 30 MG/ML INJECTION: 30 mg/mL (1 mL) | INTRAMUSCULAR | Qty: 1

## 2017-11-17 NOTE — ED Notes (Signed)
Pt to ct

## 2017-11-17 NOTE — ED Provider Notes (Signed)
Patient is a 62 year old female who is coming in with Severe lower back pain.  She states it radiates around both sides of her back and down into her legs.  She has history of chronic back pain also has history of kidney stones.  She states she was working this morning and the pain had suddenly and out of the blue.  She denies any urinary symptoms.  Did not report fevers but had 1 at triage.  Her pain is constant and 10 out of 10.             Past Medical History:   Diagnosis Date   ??? Gastrointestinal disorder     GERD   ??? Hypertension    ??? Other ill-defined conditions(799.89)     enlarged heart.        Past Surgical History:   Procedure Laterality Date   ??? HX GYN      hyst '87,, BTL   ??? HX ORTHOPAEDIC      lumbar radiculopathy         History reviewed. No pertinent family history.    Social History     Socioeconomic History   ??? Marital status: DIVORCED     Spouse name: Not on file   ??? Number of children: Not on file   ??? Years of education: Not on file   ??? Highest education level: Not on file   Social Needs   ??? Financial resource strain: Not on file   ??? Food insecurity - worry: Not on file   ??? Food insecurity - inability: Not on file   ??? Transportation needs - medical: Not on file   ??? Transportation needs - non-medical: Not on file   Occupational History   ??? Not on file   Tobacco Use   ??? Smoking status: Never Smoker   ??? Smokeless tobacco: Never Used   Substance and Sexual Activity   ??? Alcohol use: No   ??? Drug use: Not on file   ??? Sexual activity: Not on file   Other Topics Concern   ??? Not on file   Social History Narrative   ??? Not on file         ALLERGIES: Neurontin [gabapentin]    Review of Systems   Constitutional: Negative for chills and fever.   Respiratory: Negative for chest tightness, shortness of breath, wheezing and stridor.    Cardiovascular: Negative for chest pain and palpitations.   Gastrointestinal: Negative for abdominal pain, diarrhea, nausea and vomiting.    Musculoskeletal: Negative for neck pain and neck stiffness.   Skin: Negative.    All other systems reviewed and are negative.      Vitals:    11/17/17 1038   BP: 119/83   Pulse: 89   Resp: 18   Temp: 100.4 ??F (38 ??C)   SpO2: 96%   Weight: 77.1 kg (170 lb)   Height: '5\' 6"'  (1.676 m)            Physical Exam   Constitutional: She appears well-developed and well-nourished. She appears distressed (appears uncomfortable from pain).   HENT:   Head: Normocephalic and atraumatic.   Eyes: Conjunctivae are normal. No scleral icterus.   Neck: Normal range of motion.   Pulmonary/Chest: Effort normal. No stridor. No respiratory distress.   Abdominal: Soft. She exhibits no mass. There is no tenderness. There is no rebound and no guarding.   Musculoskeletal:   Diffuse lower abdominal pain with palpation.  Neurological: She is alert.   Psychiatric: Her affect is angry and labile.   Agitated   Nursing note and vitals reviewed.       MDM  Number of Diagnoses or Management Options  Diagnosis management comments: Patient did feel febrile on reevaluation.  Temperature went up.  Chest x-ray was added no signs of pneumonia.  CT shows some mild hydro-but no clear kidney stone.  Case may represent pyelonephritis given her symptoms.  I'm going to put her on antibiotics and pain control.    Freddi Che, MD; 11/17/2017 '@1' :73 PM Voice dictation software was used during the making of this note.  This software is not perfect and grammatical and other typographical errors may be present.  This note has not been proofread for errors.  ===================================================================        Amount and/or Complexity of Data Reviewed  Clinical lab tests: ordered and reviewed (Results for orders placed or performed during the hospital encounter of 11/17/17  -CBC WITH AUTOMATED DIFF       Result                      Value             Ref Range           WBC                         9.1               4.3 - 11.1 K*        RBC                         4.25              4.05 - 5.2 M*       HGB                         13.0              11.7 - 15.4 *       HCT                         39.6              35.8 - 46.3 %       MCV                         93.2              79.6 - 97.8 *       MCH                         30.6              26.1 - 32.9 *       MCHC                        32.8              31.4 - 35.0 *       RDW                         12.4  11.9 - 14.6 %       PLATELET                    257               150 - 450 K/*       MPV                         10.0              9.4 - 12.3 FL       ABSOLUTE NRBC               0.00              0.0 - 0.2 K/*       DF                          AUTOMATED                             NEUTROPHILS                 79 (H)            43 - 78 %           LYMPHOCYTES                 11 (L)            13 - 44 %           MONOCYTES                   8                 4.0 - 12.0 %        EOSINOPHILS                 1                 0.5 - 7.8 %         BASOPHILS                   0                 0.0 - 2.0 %         IMMATURE GRANULOCYTES       0                 0.0 - 5.0 %         ABS. NEUTROPHILS            7.2               1.7 - 8.2 K/*       ABS. LYMPHOCYTES            1.0               0.5 - 4.6 K/*       ABS. MONOCYTES              0.8               0.1 - 1.3 K/*       ABS. EOSINOPHILS            0.1  0.0 - 0.8 K/*       ABS. BASOPHILS              0.0               0.0 - 0.2 K/*       ABS. IMM. GRANS.            0.0               0.0 - 0.5 K/*  -METABOLIC PANEL, COMPREHENSIVE       Result                      Value             Ref Range           Sodium                      137               136 - 145 mm*       Potassium                   3.5               3.5 - 5.1 mm*       Chloride                    100               98 - 107 mmo*       CO2                         32                21 - 32 mmol*       Anion gap                   5 (L)             7 - 16 mmol/L        Glucose                     84                65 - 100 mg/*       BUN                         9                 8 - 23 MG/DL        Creatinine                  0.64              0.6 - 1.0 MG*       GFR est AA                  >60               >60 ml/min/1*       GFR est non-AA              >60               >60 ml/min/1*       Calcium  9.0               8.3 - 10.4 M*       Bilirubin, total            0.3               0.2 - 1.1 MG*       ALT (SGPT)                  30                12 - 65 U/L         AST (SGOT)                  18                15 - 37 U/L         Alk. phosphatase            69                50 - 136 U/L        Protein, total              6.4               6.3 - 8.2 g/*       Albumin                     3.7               3.2 - 4.6 g/*       Globulin                    2.7               2.3 - 3.5 g/*       A-G Ratio                   1.4               1.2 - 3.5     )  Tests in the radiology section of CPT??: ordered and reviewed (Xr Chest Pa Lat    Result Date: 11/17/2017  Two view chest History: Cough, fever. Comparison: 09/12/2009 Findings: The heart and mediastinal silhouette are normal in size and configuration. The lungs and pleural spaces are clear. The pulmonary vascularity is within normal limits. The visualized osseous structures are unremarkable.     Impression: No active disease in the chest.     Ct Urogram Wo Cont    Result Date: 11/17/2017  CT abdomen and pelvis without contrast (renal stone protocol) History: Low back pain which radiates to lower extremities. Technique: Helically acquired images were obtained from the upper abdomen to the ischial tuberosities reconstructed at 3.9m intervals without oral or IV contrast. Coronal reformatted images were submitted. Radiation dose reduction techniques were used for this study:  Our CT scanners use one or all of the following: Automated exposure control, adjustment of the mA and/or kVp  according to patient's size, iterative reconstruction. Comparison: 09/12/2009 CT ABDOMEN: There is exclusion of the superior portions of the abdomen including portions of the liver and spleen. The visualized portions of the liver and spleen  are unremarkable on this noncontrast study. There is also exclusion of a portion of the left adrenal gland. The visualized portions of the adrenal glands are unremarkable. At the  lower pole right kidney is a 4 mm calculus. There is mild right hydronephrosis. There is a calcification on image 42 within the retroperitoneum which is likely extra ureteral as this was present previously. There are postoperative changes of the upper abdomen suggesting prior gastric bypass surgery. Correlation with surgical history is recommended. The gallbladder appears distended without definite surrounding inflammatory change. No adenopathy or ascites is present. There are several calcifications within the bilateral lower quadrants, unchanged perhaps representing the ovaries. CT PELVIS: Numerous pelvic calcifications are present bilaterally. No definite distal ureteral calculi are present. No adenopathy or ascites is present. The patient status post hysterectomy. There are scattered signal diverticula.     IMPRESSION: 1. Suspected mild right hydronephrosis without a definite calculus identified. If symptoms persist, consider contrast pyelography for further evaluation. 2. Lower pole right renal calculus. 3. Distended gallbladder without definite surrounding inflammatory change.    )  Independent visualization of images, tracings, or specimens: yes           Procedures

## 2017-11-17 NOTE — ED Notes (Signed)
I have reviewed discharge instructions with the patient.  The patient verbalized understanding.    Patient left ED via Discharge Method: ambulatory to Home with family.    Opportunity for questions and clarification provided.       Patient given 1 scripts.         To continue your aftercare when you leave the hospital, you may receive an automated call from our care team to check in on how you are doing.  This is a free service and part of our promise to provide the best care and service to meet your aftercare needs.??? If you have questions, or wish to unsubscribe from this service please call [phone].  Thank you for Choosing our [facility].

## 2017-11-17 NOTE — ED Notes (Signed)
Pt to XR

## 2017-11-17 NOTE — ED Triage Notes (Signed)
Pt to ed with c/o lower back pain that radiates down both of her legs - pt reports that it started at work this morning - pt denies any urinary symptoms - pt reports nausea

## 2017-12-06 ENCOUNTER — Other Ambulatory Visit: Payer: Self-pay | Admitting: Family Medicine

## 2017-12-06 DIAGNOSIS — I10 Essential (primary) hypertension: Secondary | ICD-10-CM

## 2018-02-07 ENCOUNTER — Other Ambulatory Visit: Payer: Self-pay | Admitting: Obstetrics and Gynecology

## 2018-02-07 DIAGNOSIS — R52 Pain, unspecified: Secondary | ICD-10-CM

## 2018-02-13 ENCOUNTER — Ambulatory Visit
Admission: RE | Admit: 2018-02-13 | Discharge: 2018-02-13 | Disposition: A | Discharge status: Home / Self Care | Payer: 59 | Source: Ambulatory Visit | Attending: Obstetrics and Gynecology | Admitting: Obstetrics and Gynecology

## 2018-02-13 ENCOUNTER — Ambulatory Visit: Payer: 59

## 2018-02-13 DIAGNOSIS — R52 Pain, unspecified: Secondary | ICD-10-CM

## 2018-04-12 ENCOUNTER — Ambulatory Visit
Admission: RE | Admit: 2018-04-12 | Discharge: 2018-04-12 | Disposition: A | Discharge status: Home / Self Care | Payer: 59 | Source: Ambulatory Visit | Attending: Family Medicine | Admitting: Family Medicine

## 2018-04-12 ENCOUNTER — Other Ambulatory Visit: Payer: Self-pay | Admitting: Family Medicine

## 2018-04-12 DIAGNOSIS — M5410 Radiculopathy, site unspecified: Secondary | ICD-10-CM

## 2018-09-18 ENCOUNTER — Other Ambulatory Visit: Payer: Self-pay | Admitting: Obstetrics and Gynecology

## 2018-09-18 DIAGNOSIS — Z1231 Encounter for screening mammogram for malignant neoplasm of breast: Secondary | ICD-10-CM

## 2018-10-30 ENCOUNTER — Ambulatory Visit: Payer: 59

## 2018-12-05 ENCOUNTER — Ambulatory Visit
Admission: RE | Admit: 2018-12-05 | Discharge: 2018-12-05 | Disposition: A | Discharge status: Home / Self Care | Payer: 59 | Source: Ambulatory Visit | Attending: Obstetrics and Gynecology | Admitting: Obstetrics and Gynecology

## 2018-12-05 DIAGNOSIS — Z1231 Encounter for screening mammogram for malignant neoplasm of breast: Secondary | ICD-10-CM

## 2019-06-30 ENCOUNTER — Telehealth: Payer: 59 | Admitting: Nurse Practitioner

## 2019-06-30 DIAGNOSIS — Z20822 Contact with and (suspected) exposure to covid-19: Secondary | ICD-10-CM

## 2019-06-30 DIAGNOSIS — R0602 Shortness of breath: Secondary | ICD-10-CM

## 2019-06-30 MED ORDER — ALBUTEROL SULFATE HFA 108 (90 BASE) MCG/ACT IN AERS: 2.0000 | INHALATION_SPRAY | Freq: Four times a day (QID) | RESPIRATORY_TRACT | 0 refills | Status: DC | PRN

## 2019-06-30 NOTE — Progress Notes (Signed)
E-Visit for Corona Virus Screening   Your current symptoms could be consistent with the coronavirus.  Many health care providers can now test patients at their office but not all are.  [facility] has multiple testing sites. For information on our COVID testing locations and hours go to [email or web address]  Please quarantine yourself while awaiting your test results.  We are enrolling you in our MyChart Home Montioring for COVID19 . Daily you will receive a questionnaire within the MyChart website. Our COVID 19 response team willl be monitoriing your responses daily.  You can go to one of the  testing sites listed below, while they are opened (see hours). You do not need a doctors order to be tested for covid.You do need to self-isolate until your results return and if positive 14 days from when your symptoms started and until you are 3 days symptom free.   Testing Locations (Monday - Friday, 8 a.m. - 3:30 p.m.) . [place]: [facility], [address], [place], [place]  . [place]: [facility], [address], [place], [place] (entrance off [address])  . [place]: [address], [place], [place] (across from [facility])     COVID-19 is a respiratory illness with symptoms that are similar to the flu. Symptoms are typically mild to moderate, but there have been cases of severe illness and death due to the virus. The following symptoms may appear 2-14 days after exposure: . Fever . Cough . Shortness of breath or difficulty breathing . Chills . Repeated shaking with chills . Muscle pain . Headache . Sore throat . New loss of taste or smell . Fatigue . Congestion or runny nose . Nausea or vomiting . Diarrhea  It is vitally important that if you feel that you have an infection such as this virus or any other virus that you stay home and away from places where you may  spread it to others.  You should self-quarantine for 14 days if you have symptoms that could potentially be coronavirus or have been in close contact a with a person diagnosed with COVID-19 within the last 2 weeks. You should avoid contact with people age 65 and older.   You should wear a mask or cloth face covering over your nose and mouth if you must be around other people or animals, including pets (even at home). Try to stay at least 6 feet away from other people. This will protect the people around you.  You can use medication such as A prescription inhaler called Albuterol MDI 90 mcg /actuation 2 puffs every 4 hours as needed for shortness of breath, wheezing, cough  You may also take acetaminophen (Tylenol) as needed for fever.   Reduce your risk of any infection by using the same precautions used for avoiding the common cold or flu:  . Wash your hands often with soap and warm water for at least 20 seconds.  If soap and water are not readily available, use an alcohol-based hand sanitizer with at least 60% alcohol.  . If coughing or sneezing, cover your mouth and nose by coughing or sneezing into the elbow areas of your shirt or coat, into a tissue or into your sleeve (not your hands). . Avoid shaking hands with others and consider head nods or verbal greetings only. . Avoid touching your eyes, nose, or mouth with unwashed hands.  . Avoid close contact with people who are sick. . Avoid   places or events with large numbers of people in one location, like concerts or sporting events. . Carefully consider travel plans you have or are making. . If you are planning any travel outside or inside the [place], visit the CDC's Travelers' Health webpage for the latest health notices. . If you have some symptoms but not all symptoms, continue to monitor at home and seek medical attention if your symptoms worsen. . If you are having a medical emergency, call 911.  HOME CARE . Only take medications as  instructed by your medical team. . Drink plenty of fluids and get plenty of rest. . A steam or ultrasonic humidifier can help if you have congestion.   GET HELP RIGHT AWAY IF YOU HAVE EMERGENCY WARNING SIGNS** FOR COVID-19. If you or someone is showing any of these signs seek emergency medical care immediately. Call 911 or proceed to your closest emergency facility if: . You develop worsening high fever. . Trouble breathing . Bluish lips or face . Persistent pain or pressure in the chest . New confusion . Inability to wake or stay awake . You cough up blood. . Your symptoms become more severe  **This list is not all possible symptoms. Contact your medical provider for any symptoms that are sever or concerning to you.   MAKE SURE YOU   Understand these instructions.  Will watch your condition.  Will get help right away if you are not doing well or get worse.  Your e-visit answers were reviewed by a board certified advanced clinical practitioner to complete your personal care plan.  Depending on the condition, your plan could have included both over the counter or prescription medications.  If there is a problem please reply once you have received a response from your provider.  Your safety is important to us.  If you have drug allergies check your prescription carefully.    You can use MyChart to ask questions about today's visit, request a non-urgent call back, or ask for a work or school excuse for 24 hours related to this e-Visit. If it has been greater than 24 hours you will need to follow up with your provider, or enter a new e-Visit to address those concerns. You will get an e-mail in the next two days asking about your experience.  I hope that your e-visit has been valuable and will speed your recovery. Thank you for using e-visits.   5-10 minutes spent reviewing and documenting in chart.  

## 2019-11-14 ENCOUNTER — Other Ambulatory Visit: Payer: 59

## 2019-12-10 ENCOUNTER — Other Ambulatory Visit: Payer: Self-pay | Admitting: *Deleted

## 2019-12-10 DIAGNOSIS — Z20822 Contact with and (suspected) exposure to covid-19: Secondary | ICD-10-CM

## 2019-12-11 LAB — NOVEL CORONAVIRUS, NAA: SARS-CoV-2, NAA: NOT DETECTED

## 2020-05-13 ENCOUNTER — Other Ambulatory Visit: Payer: Self-pay | Admitting: Obstetrics and Gynecology

## 2020-05-13 DIAGNOSIS — Z1231 Encounter for screening mammogram for malignant neoplasm of breast: Secondary | ICD-10-CM

## 2020-05-16 ENCOUNTER — Ambulatory Visit
Admission: RE | Admit: 2020-05-16 | Discharge: 2020-05-16 | Disposition: A | Discharge status: Home / Self Care | Payer: 59 | Source: Ambulatory Visit | Attending: Obstetrics and Gynecology | Admitting: Obstetrics and Gynecology

## 2020-05-16 ENCOUNTER — Other Ambulatory Visit: Payer: Self-pay

## 2020-05-16 DIAGNOSIS — Z1231 Encounter for screening mammogram for malignant neoplasm of breast: Secondary | ICD-10-CM

## 2021-06-30 ENCOUNTER — Other Ambulatory Visit: Payer: Self-pay | Admitting: Family Medicine

## 2021-06-30 DIAGNOSIS — Z1231 Encounter for screening mammogram for malignant neoplasm of breast: Secondary | ICD-10-CM

## 2021-07-02 ENCOUNTER — Other Ambulatory Visit: Payer: Self-pay

## 2021-07-02 ENCOUNTER — Ambulatory Visit
Admission: RE | Admit: 2021-07-02 | Discharge: 2021-07-02 | Disposition: A | Discharge status: Home / Self Care | Payer: BC Managed Care – PPO | Source: Ambulatory Visit | Attending: Family Medicine | Admitting: Family Medicine

## 2021-07-02 DIAGNOSIS — Z1231 Encounter for screening mammogram for malignant neoplasm of breast: Secondary | ICD-10-CM

## 2022-06-15 ENCOUNTER — Other Ambulatory Visit: Payer: Self-pay | Admitting: Family Medicine

## 2022-06-15 DIAGNOSIS — Z1231 Encounter for screening mammogram for malignant neoplasm of breast: Secondary | ICD-10-CM

## 2022-09-20 ENCOUNTER — Ambulatory Visit
Admission: RE | Admit: 2022-09-20 | Discharge: 2022-09-20 | Disposition: A | Discharge status: Home / Self Care | Payer: BC Managed Care – PPO | Source: Ambulatory Visit | Attending: Family Medicine | Admitting: Family Medicine

## 2022-09-20 DIAGNOSIS — Z1231 Encounter for screening mammogram for malignant neoplasm of breast: Secondary | ICD-10-CM

## 2022-09-23 ENCOUNTER — Other Ambulatory Visit: Payer: Self-pay | Admitting: Family Medicine

## 2022-09-23 DIAGNOSIS — R928 Other abnormal and inconclusive findings on diagnostic imaging of breast: Secondary | ICD-10-CM

## 2022-10-12 ENCOUNTER — Ambulatory Visit
Admission: RE | Admit: 2022-10-12 | Discharge: 2022-10-12 | Disposition: A | Discharge status: Home / Self Care | Payer: Medicare PPO | Source: Ambulatory Visit | Attending: Family Medicine | Admitting: Family Medicine

## 2022-10-12 ENCOUNTER — Ambulatory Visit
Admission: RE | Admit: 2022-10-12 | Discharge: 2022-10-12 | Disposition: A | Discharge status: Home / Self Care | Payer: BC Managed Care – PPO | Source: Ambulatory Visit | Attending: Family Medicine | Admitting: Family Medicine

## 2022-10-12 DIAGNOSIS — R928 Other abnormal and inconclusive findings on diagnostic imaging of breast: Secondary | ICD-10-CM

## 2022-10-14 ENCOUNTER — Other Ambulatory Visit: Payer: Self-pay | Admitting: Family Medicine

## 2022-10-14 DIAGNOSIS — N6489 Other specified disorders of breast: Secondary | ICD-10-CM

## 2022-12-14 IMAGING — MG MM DIGITAL SCREENING BILAT W/ TOMO AND CAD
8 series · 8 of 24 positions shown · non-contrast
Comparison: Previous exam(s).

CLINICAL DATA: Screening. History of LEFT breast complex sclerosing
lesion status post surgical excision.

EXAM:
DIGITAL SCREENING BILATERAL MAMMOGRAM WITH TOMOSYNTHESIS AND CAD
TECHNIQUE: Bilateral screening digital craniocaudal and mediolateral oblique
mammograms were obtained. Bilateral screening digital breast
tomosynthesis was performed. The images were evaluated with
computer-aided detection.

[R CC synth-2D]
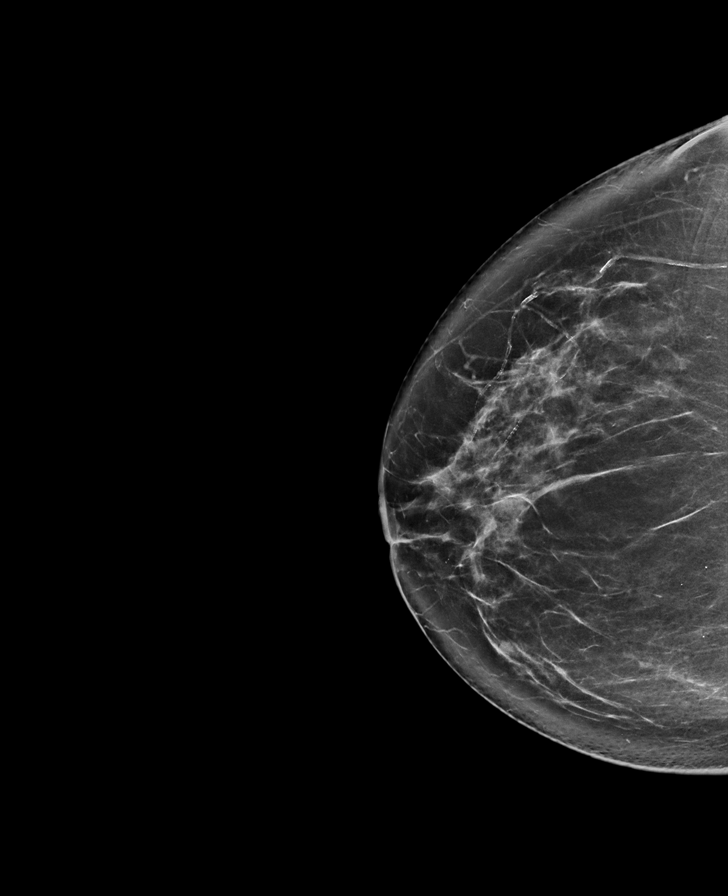

[L MLO synth-2D]
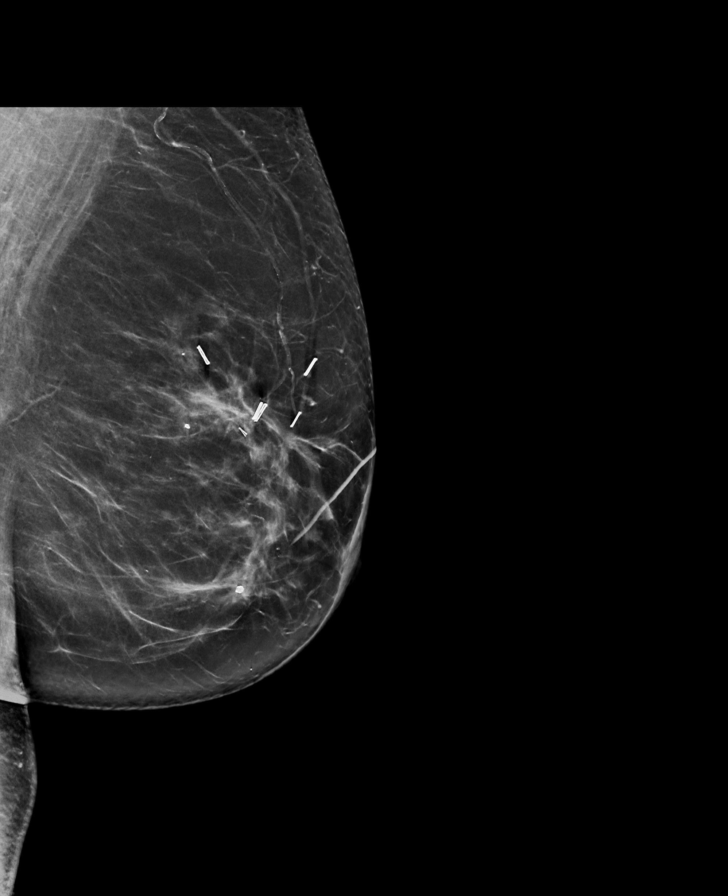

[R MLO synth-2D]
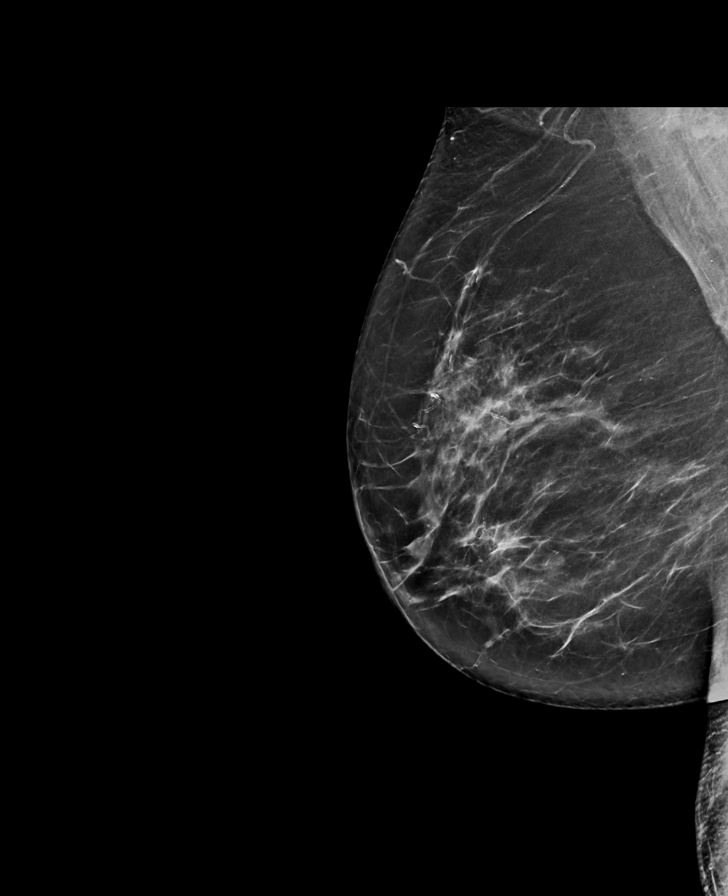

[L CC synth-2D]
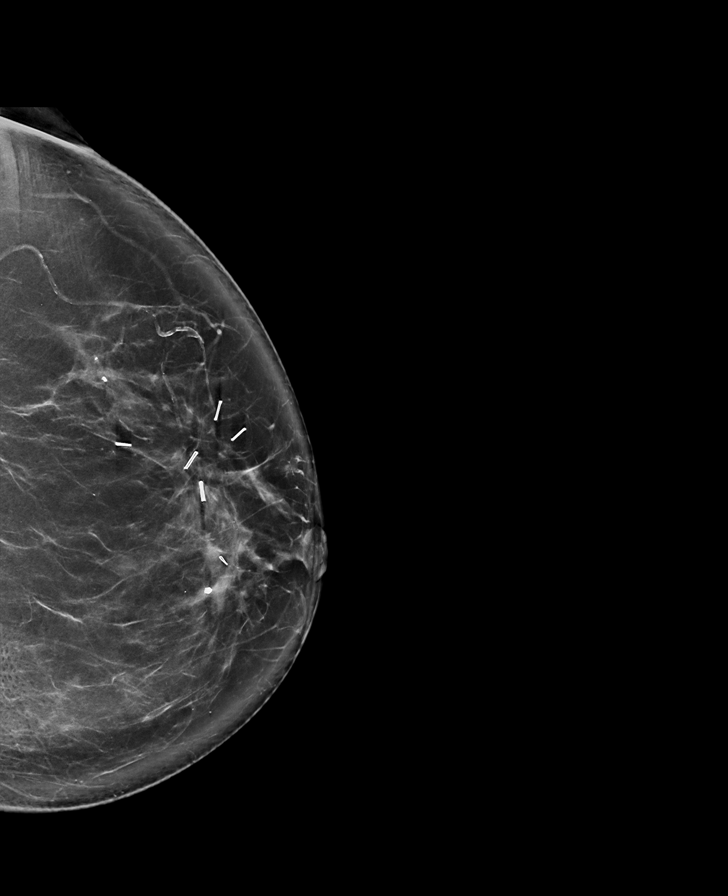

[R MLO tomo · tomo slice 41/82.0]
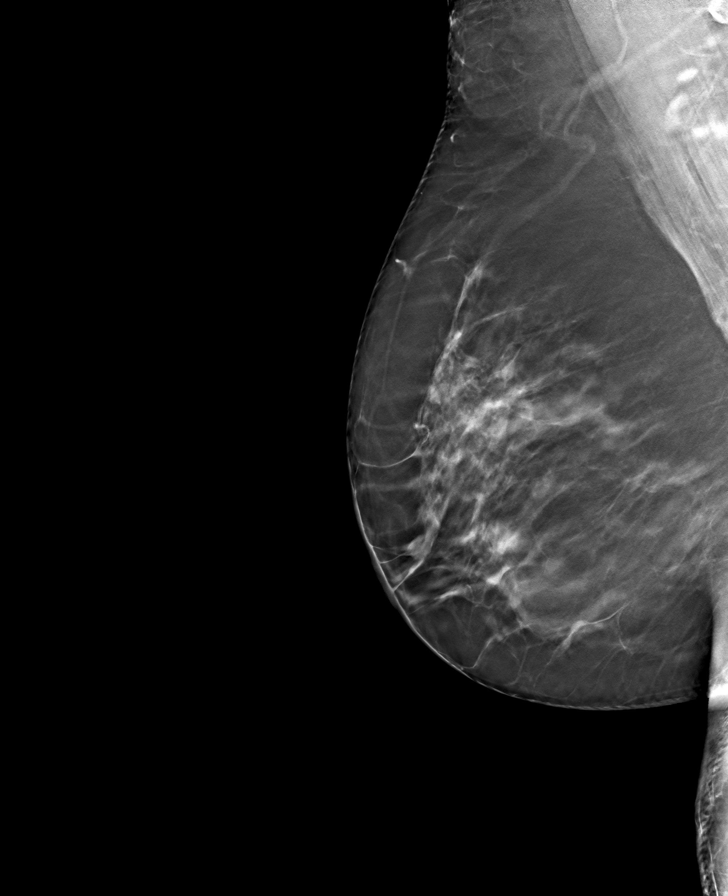

[L MLO tomo · tomo slice 42/83.0]
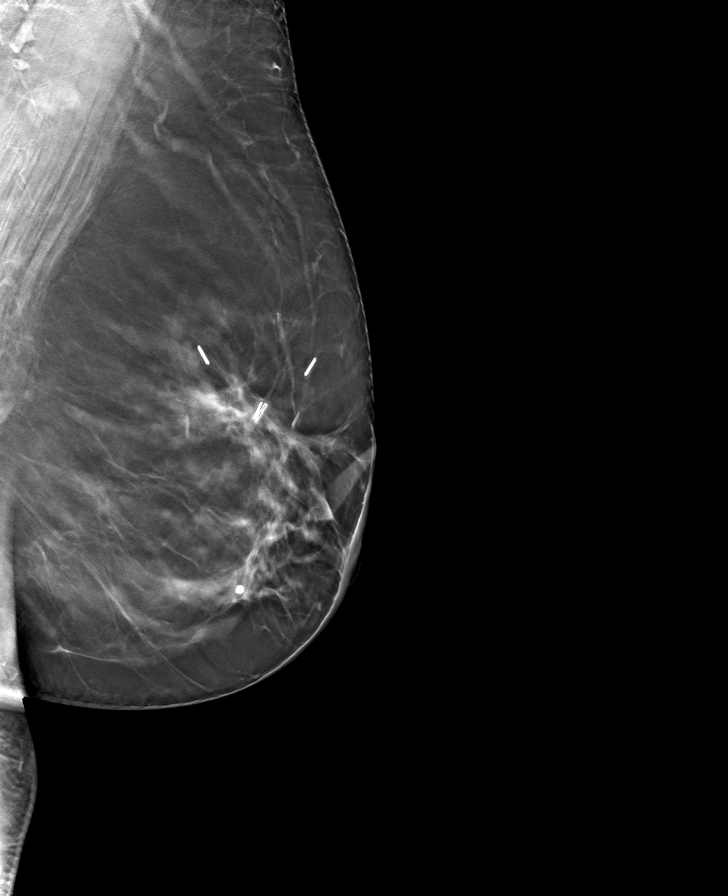

[L CC tomo · tomo slice 44/87.0]
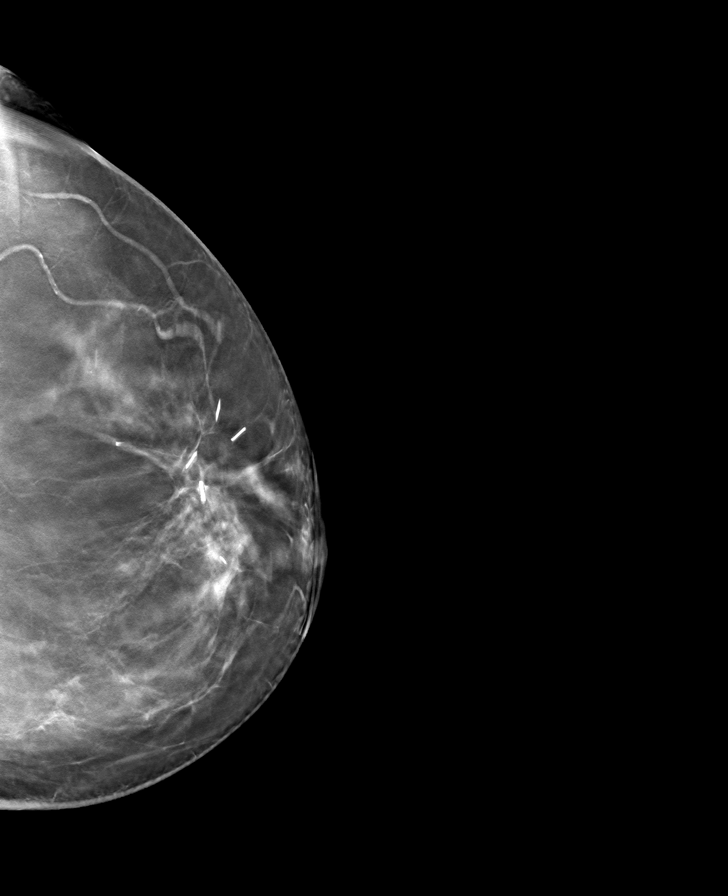

[R CC tomo · tomo slice 41/82.0]
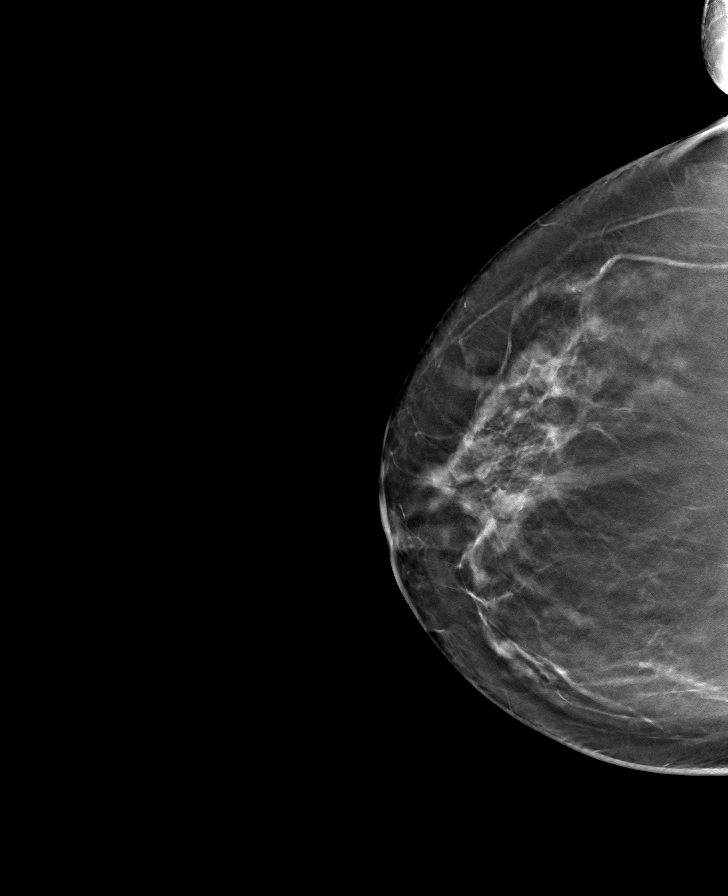

[8 of 24 positions shown; findings below may reference images not displayed]

ACR Breast Density Category b: There are scattered areas of
fibroglandular density.
FINDINGS: There are no findings suspicious for malignancy.
IMPRESSION: No mammographic evidence of malignancy. A result letter of this
screening mammogram will be mailed directly to the patient.

RECOMMENDATION:
Screening mammogram in one year. (Code:N7-M-F8N)

BI-RADS CATEGORY  1: Negative.

## 2023-02-26 LAB — HEMOGLOBIN A1C
Estimated Avg Glucose, External: 137 mg/dL
Hemoglobin A1C, External: 6.4 % — ABNORMAL HIGH (ref <5.7)

## 2023-03-17 ENCOUNTER — Ambulatory Visit: Payer: Medicare PPO | Admitting: Internal Medicine

## 2023-03-24 NOTE — Progress Notes (Signed)
Cardiology Office Note:    Date:  03/25/2023   ID:  Cassandra Hicks, DOB 29-May-1956, MRN [id number]  PCP:  Dois Davenport, MD   [facility] Providers Cardiologist:  None     Referring MD: Dois Davenport, MD   No chief complaint on file. Afib  History of Present Illness:    Cassandra Hicks is a 67 y.o. female with a hx of GERD, HTN, former smoker, referral from her family medicine physician Dr. Aniceto Boss. She is noted to have hx of atrial fibrillation.  She is on a BB. She takes xarelto 15 mg daily samples, her PCP noted xarelto 20 mg daily in the note. She states she has been taking for 6-8 weeks. Noted palpitations about 5-6 months ago starting in Feb. , she was with an exercise trainer who noted her HR was abnormal. She saw her PCP and EKG showed afib. She feels fatigued and SOB. Low energy.  no thyroid dx No hx of  heart failure No hx of  sleep apnea, or signs No DM2 No CVA hx Brother , sisters x2 have afib  Past Medical History:  Diagnosis Date   Abnormal mammogram of left breast 09/17/2016   Anxiety    Breast mass, left    GERD (gastroesophageal reflux disease)    mild, no meds, diet controlled   Heart murmur    as child, no problem   Hypertension    Missed ab    x 2  D&E   SVD (spontaneous vaginal delivery)    x 2    Past Surgical History:  Procedure Laterality Date   ABDOMINAL HYSTERECTOMY N/A 12/09/2014   Procedure: HYSTERECTOMY ABDOMINAL;  Surgeon: Meriel Pica, MD;  Location: [facility];  Service: Gynecology;  Laterality: N/A;   APPENDECTOMY     BREAST BIOPSY Left 08/10/2016   BREAST EXCISIONAL BIOPSY Left 09/2016   BREAST LUMPECTOMY WITH RADIOACTIVE SEED LOCALIZATION Left 09/17/2016   Procedure: LEFT BREAST LUMPECTOMY WITH RADIOACTIVE SEED LOCALIZATION;  Surgeon: Claud Kelp, MD;  Location: [facility];  Service: General;  Laterality: Left;   COLONOSCOPY     DIAGNOSTIC LAPAROSCOPY     DILATION AND CURETTAGE OF UTERUS     x 2 for  MAB   ECTOPIC PREGNANCY SURGERY     x 2   EYE SURGERY     bilateral - Radial Keratotomy    SALPINGOOPHORECTOMY Bilateral 12/09/2014   Procedure: SALPINGO OOPHORECTOMY;  Surgeon: Meriel Pica, MD;  Location: [facility];  Service: Gynecology;  Laterality: Bilateral;   TONSILLECTOMY     TUBAL LIGATION     WISDOM TOOTH EXTRACTION      Current Medications: Current Meds  Medication Sig   ibuprofen (ADVIL,MOTRIN) 200 MG tablet Take 400 mg by mouth every 6 (six) hours as needed (pain).   losartan-hydrochlorothiazide (HYZAAR) 50-12.5 MG tablet Take 0.5 tablets by mouth daily. Take 0.5 Tablet Daily   metoprolol succinate (TOPROL-XL) 50 MG 24 hr tablet Take 1 tablet (50 mg total) by mouth daily. Take with or immediately following a meal.   metoprolol tartrate (LOPRESSOR) 25 MG tablet Take by mouth.     Allergies:   Penicillins   Social History   Socioeconomic History   Marital status: Married    Spouse name: Not on file   Number of children: Not on file   Years of education: Not on file   Highest education level: Not on file  Occupational History   Not on file  Tobacco Use   Smoking status: Former    Packs/day: 1.00    Years: 30.00    Additional pack years: 0.00    Total pack years: 30.00    Types: Cigarettes    Quit date: 12/07/1996    Years since quitting: 26.3   Smokeless tobacco: Never  Substance and Sexual Activity   Alcohol use: Yes    Alcohol/week: 12.0 standard drinks of alcohol    Types: 12 Cans of beer per week    Comment: social/weekends   Drug use: No   Sexual activity: Yes    Birth control/protection: Post-menopausal  Other Topics Concern   Not on file  Social History Narrative   Not on file   Social Determinants of Health   Financial Resource Strain: Not on file  Food Insecurity: Not on file  Transportation Needs: Not on file  Physical Activity: Not on file  Stress: Not on file  Social Connections: Not on file     Family History: The patient's  family history includes COPD in her mother; Cancer in her father; Hypertension in her brother, father, and sister.  ROS:   Please see the history of present illness.     All other systems reviewed and are negative.  EKGs/Labs/Other Studies Reviewed:    The following studies were reviewed today:   EKG:  EKG is  ordered today.  The ekg ordered today demonstrates   03/25/2023- afib rates 126 bpm  Recent Labs: No results found for requested labs within last 365 days.   Recent Lipid Panel    Component Value Date/Time   CHOL 199 06/05/2015 0900   TRIG 170 (H) 06/05/2015 0900   HDL 57 06/05/2015 0900   CHOLHDL 3.5 06/05/2015 0900   VLDL 34 (H) 06/05/2015 0900   LDLCALC 108 06/05/2015 0900     Risk Assessment/Calculations:    CHA2DS2-VASc Score = 3   This indicates a 3.2% annual risk of stroke. The patient's score is based upon: CHF History: 0 HTN History: 1 Diabetes History: 0 Stroke History: 0 Vascular Disease History: 0 Age Score: 1 Gender Score: 1          Physical Exam:    VS: Vitals:   03/25/23 0832  BP: 126/74  Pulse: (!) 126  SpO2: 99%    Wt Readings from Last 3 Encounters:  11/11/16 179 lb (81.2 kg)  09/17/16 175 lb (79.4 kg)  06/05/15 177 lb (80.3 kg)     GEN:  Well nourished, well developed in no acute distress HEENT: Normal NECK: No JVD CARDIAC: IRRR, no murmurs, rubs, gallops RESPIRATORY:  Clear to auscultation without rales, wheezing or rhonchi  ABDOMEN: Soft, non-tender, non-distended MUSCULOSKELETAL:  No edema; No deformity  SKIN: Warm and dry NEUROLOGIC:  Alert and oriented x 3 PSYCHIATRIC:  Normal affect   ASSESSMENT:    Paroxymal Atrial Fibrillation:  - in RVR today - TEE/DCCV [ will do TEE since was on 15 mg of xarelto] - will prescribe xarelto 20 mg daily  - continue metop 50 mg XL daily PLAN:    In order of problems listed above:  TEE/DCCV; orders placed Xarelto 20 mg daily TTE TSH Follow up 3 months     Shared  Decision Making/Informed Consent The risks [stroke, cardiac arrhythmias rarely resulting in the need for a temporary or permanent pacemaker, skin irritation or burns, esophageal damage, perforation (1:10,000 risk), bleeding, pharyngeal hematoma as well as other potential complications associated with conscious sedation including aspiration, arrhythmia, respiratory failure and  death], benefits (treatment guidance, restoration of normal sinus rhythm, diagnostic support) and alternatives of a transesophageal echocardiogram guided cardioversion were discussed in detail with Cassandra Hicks and she is willing to proceed.    Medication Adjustments/Labs and Tests Ordered: Current medicines are reviewed at length with the patient today.  Concerns regarding medicines are outlined above.  No orders of the defined types were placed in this encounter.  No orders of the defined types were placed in this encounter.   There are no Patient Instructions on file for this visit.   Signed, Maisie Fus, MD  03/25/2023 8:35 AM     [facility]

## 2023-03-25 ENCOUNTER — Ambulatory Visit: Payer: Medicare PPO | Attending: Internal Medicine | Admitting: Internal Medicine

## 2023-03-25 ENCOUNTER — Encounter: Payer: Self-pay | Admitting: Internal Medicine

## 2023-03-25 VITALS — BP 126/74 | HR 126 | Ht 61.0 in | Wt 178.8 lb

## 2023-03-25 DIAGNOSIS — I4891 Unspecified atrial fibrillation: Secondary | ICD-10-CM | POA: Diagnosis not present

## 2023-03-25 MED ORDER — RIVAROXABAN 20 MG PO TABS: 20.0000 mg | ORAL_TABLET | Freq: Every day | ORAL | 5 refills | Status: DC

## 2023-03-25 NOTE — Patient Instructions (Signed)
Medication Instructions:   STOP XARELTO 15mg   START: XARELTO 20mg  ONCE DAILY   *If you need a refill on your cardiac medications before your next appointment, please call your pharmacy*  Lab Work: TODAY If you have labs (blood work) drawn today and your tests are completely normal, you will receive your results only by: MyChart Message (if you have MyChart) OR A paper copy in the mail If you have any lab test that is abnormal or we need to change your treatment, we will call you to review the results.  Testing/Procedures: Your physician has recommended that you have a Cardioversion (DCCV). Electrical Cardioversion uses a jolt of electricity to your heart either through paddles or wired patches attached to your chest. This is a controlled, usually prescheduled, procedure. Defibrillation is done under light anesthesia in the hospital, and you usually go home the day of the procedure. This is done to get your heart back into a normal rhythm. You are not awake for the procedure. Please see the instruction sheet given to you today.  Your physician has requested that you have an echocardiogram. Echocardiography is a painless test that uses sound waves to create images of your heart. It provides your doctor with information about the size and shape of your heart and how well your heart's chambers and valves are working. You may receive an ultrasound enhancing agent through an IV if needed to better visualize your heart during the echo.This procedure takes approximately one hour. There are no restrictions for this procedure. This will take place at the [address], [address].   Follow-Up: At [facility], you and your health needs are our priority.  As part of our continuing mission to provide you with exceptional heart care, we have created designated Provider Care Teams.  These Care Teams include your primary Cardiologist (physician) and Advanced Practice Providers (APPs -  Physician  Assistants and Nurse Practitioners) who all work together to provide you with the care you need, when you need it.  Your next appointment:   3 month(s)  Provider:   Maisie Fus, MD     Other Instructions   Dear Luana Shu  You are scheduled for a TEE (Transesophageal Echocardiogram) Guided Cardioversion on Monday, May 20 with Dr. Jens Som.  Please arrive at the [facility] (Main Entrance A) at [facility]: [address] [place], [place] [address] at 11:00 AM (This time is 1 hour(s) before your procedure to ensure your preparation). Free valet parking service is available. You will check in at ADMITTING. The support person will be asked to wait in the waiting room.  It is OK to have someone drop you off and come back when you are ready to be discharged.     DIET:  Nothing to eat or drink after midnight except a sip of water with medications (see medication instructions below)  MEDICATION INSTRUCTIONS: !!IF ANY NEW MEDICATIONS ARE STARTED AFTER TODAY, PLEASE NOTIFY YOUR PROVIDER AS SOON AS POSSIBLE!!  FYI: Medications such as Semaglutide (Ozempic, [place]), Tirzepatide (Mounjaro, Zepbound), Dulaglutide (Trulicity), etc ("GLP1 agonists") must be held around the time of a procedure. Talk to your provider if you take one of these.  DO NOT TAKE LOSARTAN-HCTZ ON MORNING OF PROCEDURE  LABS: TODAY  FYI:  For your safety, and to allow [place] to monitor your vital signs accurately during the surgery/procedure we request: If you have artificial nails, gel coating, SNS etc, please have those removed prior to your  surgery/procedure. Not having the nail coverings /polish removed may result in cancellation or delay of your surgery/procedure.  You must have a responsible person to drive you home and stay in the waiting area during your procedure. Failure to do so could result in cancellation.  Bring your insurance cards.  *Special Note: Every effort is made to have your procedure done on  time. Occasionally there are emergencies that occur at the hospital that may cause delays. Please be patient if a delay does occur.

## 2023-03-25 NOTE — Progress Notes (Signed)
Spoke to pt and instructed them to come to the hospital at 1100 on 03/28/23.  Instructed pt to be npo after 0000 on 03/28/23 and to take morning meds with a small sip of water.  Stated that pt needs to have a ride home and a responsible adult stay with them for 24 hours after the procedure.

## 2023-03-26 LAB — CBC
Hematocrit: 44.1 % (ref 34.0–46.6)
Hemoglobin: 14.6 g/dL (ref 11.1–15.9)
MCH: 32 pg (ref 26.6–33.0)
MCHC: 33.1 g/dL (ref 31.5–35.7)
MCV: 97 fL (ref 79–97)
Platelets: 231 10*3/uL (ref 150–450)
RBC: 4.56 x10E6/uL (ref 3.77–5.28)
RDW: 12.7 % (ref 11.7–15.4)
WBC: 7.1 10*3/uL (ref 3.4–10.8)

## 2023-03-26 LAB — TSH: TSH: 2.07 u[IU]/mL (ref 0.450–4.500)

## 2023-03-26 LAB — BASIC METABOLIC PANEL
BUN/Creatinine Ratio: 11 — ABNORMAL LOW (ref 12–28)
BUN: 14 mg/dL (ref 8–27)
CO2: 24 mmol/L (ref 20–29)
Calcium: 9.9 mg/dL (ref 8.7–10.3)
Chloride: 104 mmol/L (ref 96–106)
Creatinine, Ser: 1.25 mg/dL — ABNORMAL HIGH (ref 0.57–1.00)
Glucose: 100 mg/dL — ABNORMAL HIGH (ref 70–99)
Potassium: 4.3 mmol/L (ref 3.5–5.2)
Sodium: 143 mmol/L (ref 134–144)
eGFR: 48 mL/min/{1.73_m2} — ABNORMAL LOW (ref >59)

## 2023-03-28 ENCOUNTER — Ambulatory Visit (HOSPITAL_BASED_OUTPATIENT_CLINIC_OR_DEPARTMENT_OTHER): Payer: Medicare PPO

## 2023-03-28 ENCOUNTER — Ambulatory Visit (HOSPITAL_COMMUNITY)
Admission: RE | Admit: 2023-03-28 | Discharge: 2023-03-28 | Disposition: A | Discharge status: Home / Self Care | Payer: Medicare PPO | Attending: Cardiology | Admitting: Cardiology

## 2023-03-28 ENCOUNTER — Encounter (HOSPITAL_COMMUNITY): Payer: Self-pay | Admitting: Cardiology

## 2023-03-28 ENCOUNTER — Encounter (HOSPITAL_COMMUNITY)
Admission: RE | Disposition: A | Discharge status: Home / Self Care | Payer: Self-pay | Source: Home / Self Care | Attending: Cardiology

## 2023-03-28 ENCOUNTER — Other Ambulatory Visit: Payer: Self-pay

## 2023-03-28 ENCOUNTER — Ambulatory Visit (HOSPITAL_COMMUNITY): Payer: Medicare PPO | Admitting: Anesthesiology

## 2023-03-28 ENCOUNTER — Ambulatory Visit (HOSPITAL_BASED_OUTPATIENT_CLINIC_OR_DEPARTMENT_OTHER): Payer: Medicare PPO | Admitting: Anesthesiology

## 2023-03-28 DIAGNOSIS — I340 Nonrheumatic mitral (valve) insufficiency: Secondary | ICD-10-CM | POA: Diagnosis not present

## 2023-03-28 DIAGNOSIS — F419 Anxiety disorder, unspecified: Secondary | ICD-10-CM

## 2023-03-28 DIAGNOSIS — K219 Gastro-esophageal reflux disease without esophagitis: Secondary | ICD-10-CM | POA: Diagnosis not present

## 2023-03-28 DIAGNOSIS — I4891 Unspecified atrial fibrillation: Secondary | ICD-10-CM

## 2023-03-28 DIAGNOSIS — Z87891 Personal history of nicotine dependence: Secondary | ICD-10-CM | POA: Diagnosis not present

## 2023-03-28 DIAGNOSIS — I4819 Other persistent atrial fibrillation: Secondary | ICD-10-CM | POA: Diagnosis not present

## 2023-03-28 DIAGNOSIS — Z7901 Long term (current) use of anticoagulants: Secondary | ICD-10-CM | POA: Insufficient documentation

## 2023-03-28 DIAGNOSIS — I10 Essential (primary) hypertension: Secondary | ICD-10-CM | POA: Insufficient documentation

## 2023-03-28 HISTORY — PX: TEE WITHOUT CARDIOVERSION: SHX5443

## 2023-03-28 HISTORY — PX: CARDIOVERSION: SHX1299

## 2023-03-28 LAB — POCT I-STAT, CHEM 8
BUN: 14 mg/dL (ref 8–23)
Calcium, Ion: 1.18 mmol/L (ref 1.15–1.40)
Chloride: 106 mmol/L (ref 98–111)
Creatinine, Ser: 1.2 mg/dL — ABNORMAL HIGH (ref 0.44–1.00)
Glucose, Bld: 90 mg/dL (ref 70–99)
HCT: 43 % (ref 36.0–46.0)
Hemoglobin: 14.6 g/dL (ref 12.0–15.0)
Potassium: 3.9 mmol/L (ref 3.5–5.1)
Sodium: 143 mmol/L (ref 135–145)
TCO2: 23 mmol/L (ref 22–32)

## 2023-03-28 LAB — ECHO TEE

## 2023-03-28 SURGERY — ECHOCARDIOGRAM, TRANSESOPHAGEAL
Anesthesia: Monitor Anesthesia Care

## 2023-03-28 MED ORDER — PROPOFOL 10 MG/ML IV BOLUS
INTRAVENOUS | Status: DC | PRN
  Administered 2023-03-28: 40 mg via INTRAVENOUS
  Administered 2023-03-28: 10 mg via INTRAVENOUS

## 2023-03-28 MED ORDER — SODIUM CHLORIDE 0.9 % IV SOLN: INTRAVENOUS | Status: DC

## 2023-03-28 MED ORDER — PROPOFOL 500 MG/50ML IV EMUL
INTRAVENOUS | Status: DC | PRN
  Administered 2023-03-28: 150 ug/kg/min via INTRAVENOUS

## 2023-03-28 MED ORDER — LIDOCAINE 2% (20 MG/ML) 5 ML SYRINGE
INTRAMUSCULAR | Status: DC | PRN
  Administered 2023-03-28: 80 mg via INTRAVENOUS

## 2023-03-28 MED ORDER — PHENYLEPHRINE 80 MCG/ML (10ML) SYRINGE FOR IV PUSH (FOR BLOOD PRESSURE SUPPORT)
PREFILLED_SYRINGE | INTRAVENOUS | Status: DC | PRN
  Administered 2023-03-28 (×2): 80 ug via INTRAVENOUS

## 2023-03-28 SURGICAL SUPPLY — 1 items: ELECT DEFIB PAD ADLT CADENCE (PAD) ×2 IMPLANT

## 2023-03-28 NOTE — H&P (Signed)
Office Visit 03/25/2023 [facility] at [facility], Alben Spittle, MD Cardiology Atrial fibrillation, unspecified type [facility]) Dx Referred by Dois Davenport, MD Reason for Visit   Additional Documentation  Vitals: BP 126/74   Pulse 126 Important    Ht 5\' 1"  (1.549 m)   Wt 81.1 kg   SpO2 99%   BMI 33.78 kg/m   BSA 1.87 m  Flowsheets: CHADS2-VASc Score,   NEWS,   MEWS Score,   Vital Signs,   Anthropometrics  Encounter Info: Billing Info,   History,   Allergies,   Detailed Report   All Notes   Progress Notes by Maisie Fus, MD at 03/25/2023 8:40 AM  Author: Maisie Fus, MD Author Type: Physician Filed: 03/25/2023  9:21 AM  Note Status: Signed Cosign: Cosign Not Required Encounter Date: 03/25/2023  Editor: Maisie Fus, MD (Physician)             Expand All Collapse All  Cardiology Office Note:     Date:  03/25/2023    ID:  Cassandra Hicks, DOB [birth year], MRN [id number]   PCP:  Dois Davenport, MD              [facility] Providers Cardiologist:  None      Referring MD: Dois Davenport, MD    No chief complaint on file. Afib   History of Present Illness:     Cassandra Hicks is a 67 y.o. female with a hx of GERD, HTN, former smoker, referral from her family medicine physician Dr. Aniceto Boss. She is noted to have hx of atrial fibrillation.  She is on a BB. She takes xarelto 15 mg daily samples, her PCP noted xarelto 20 mg daily in the note. She states she has been taking for 6-8 weeks. Noted palpitations about 5-6 months ago starting in Feb. , she was with an exercise trainer who noted her HR was abnormal. She saw her PCP and EKG showed afib. She feels fatigued and SOB. Low energy.  no thyroid dx No hx of  heart failure No hx of  sleep apnea, or signs No DM2 No CVA hx Brother , sisters x2 have afib       Past Medical History:  Diagnosis Date   Abnormal mammogram of left breast 09/17/2016   Anxiety     Breast mass,  left     GERD (gastroesophageal reflux disease)      mild, no meds, diet controlled   Heart murmur      as child, no problem   Hypertension     Missed ab      x 2  D&E   SVD (spontaneous vaginal delivery)      x 2           Past Surgical History:  Procedure Laterality Date   ABDOMINAL HYSTERECTOMY N/A 12/09/2014    Procedure: HYSTERECTOMY ABDOMINAL;  Surgeon: Meriel Pica, MD;  Location: [facility];  Service: Gynecology;  Laterality: N/A;   APPENDECTOMY       BREAST BIOPSY Left 08/10/2016   BREAST EXCISIONAL BIOPSY Left 09/2016   BREAST LUMPECTOMY WITH RADIOACTIVE SEED LOCALIZATION Left 09/17/2016    Procedure: LEFT BREAST LUMPECTOMY WITH RADIOACTIVE SEED LOCALIZATION;  Surgeon: Claud Kelp, MD;  Location: [facility];  Service: General;  Laterality: Left;   COLONOSCOPY       DIAGNOSTIC LAPAROSCOPY       DILATION AND CURETTAGE OF UTERUS  x 2 for MAB   ECTOPIC PREGNANCY SURGERY        x 2   EYE SURGERY        bilateral - Radial Keratotomy    SALPINGOOPHORECTOMY Bilateral 12/09/2014    Procedure: SALPINGO OOPHORECTOMY;  Surgeon: Meriel Pica, MD;  Location: [facility];  Service: Gynecology;  Laterality: Bilateral;   TONSILLECTOMY       TUBAL LIGATION       WISDOM TOOTH EXTRACTION          Current Medications: Active Medications      Current Meds  Medication Sig   ibuprofen (ADVIL,MOTRIN) 200 MG tablet Take 400 mg by mouth every 6 (six) hours as needed (pain).   losartan-hydrochlorothiazide (HYZAAR) 50-12.5 MG tablet Take 0.5 tablets by mouth daily. Take 0.5 Tablet Daily   metoprolol succinate (TOPROL-XL) 50 MG 24 hr tablet Take 1 tablet (50 mg total) by mouth daily. Take with or immediately following a meal.   metoprolol tartrate (LOPRESSOR) 25 MG tablet Take by mouth.        Allergies:   Penicillins    Social History         Socioeconomic History   Marital status: Married      Spouse name: Not on file   Number of children: Not on file    Years of education: Not on file   Highest education level: Not on file  Occupational History   Not on file  Tobacco Use   Smoking status: Former      Packs/day: 1.00      Years: 30.00      Additional pack years: 0.00      Total pack years: 30.00      Types: Cigarettes      Quit date: 12/07/1996      Years since quitting: 26.3   Smokeless tobacco: Never  Substance and Sexual Activity   Alcohol use: Yes      Alcohol/week: 12.0 standard drinks of alcohol      Types: 12 Cans of beer per week      Comment: social/weekends   Drug use: No   Sexual activity: Yes      Birth control/protection: Post-menopausal  Other Topics Concern   Not on file  Social History Narrative   Not on file    Social Determinants of Health    Financial Resource Strain: Not on file  Food Insecurity: Not on file  Transportation Needs: Not on file  Physical Activity: Not on file  Stress: Not on file  Social Connections: Not on file      Family History: The patient's family history includes COPD in her mother; Cancer in her father; Hypertension in her brother, father, and sister.   ROS:   Please see the history of present illness.     All other systems reviewed and are negative.   EKGs/Labs/Other Studies Reviewed:     The following studies were reviewed today:     EKG:  EKG is  ordered today.  The ekg ordered today demonstrates    03/25/2023- afib rates 126 bpm   Recent Labs: No results found for requested labs within last 365 days.    Recent Lipid Panel Labs (Brief)          Component Value Date/Time    CHOL 199 06/05/2015 0900    TRIG 170 (H) 06/05/2015 0900    HDL 57 06/05/2015 0900    CHOLHDL 3.5 06/05/2015 0900    VLDL 34 (H)  06/05/2015 0900    LDLCALC 108 06/05/2015 0900          Risk Assessment/Calculations:     CHA2DS2-VASc Score = 3   This indicates a 3.2% annual risk of stroke. The patient's score is based upon: CHF History: 0 HTN History: 1 Diabetes History:  0 Stroke History: 0 Vascular Disease History: 0 Age Score: 1 Gender Score: 1             Physical Exam:     VS:    Vitals:    03/25/23 0832  BP: 126/74  Pulse: (!) 126  SpO2: 99%         Wt Readings from Last 3 Encounters:  11/11/16 179 lb (81.2 kg)  09/17/16 175 lb (79.4 kg)  06/05/15 177 lb (80.3 kg)      GEN:  Well nourished, well developed in no acute distress HEENT: Normal NECK: No JVD CARDIAC: IRRR, no murmurs, rubs, gallops RESPIRATORY:  Clear to auscultation without rales, wheezing or rhonchi  ABDOMEN: Soft, non-tender, non-distended MUSCULOSKELETAL:  No edema; No deformity  SKIN: Warm and dry NEUROLOGIC:  Alert and oriented x 3 PSYCHIATRIC:  Normal affect    ASSESSMENT:     Paroxymal Atrial Fibrillation:  - in RVR today - TEE/DCCV [ will do TEE since was on 15 mg of xarelto] - will prescribe xarelto 20 mg daily  - continue metop 50 mg XL daily PLAN:     In order of problems listed above:   TEE/DCCV; orders placed Xarelto 20 mg daily TTE TSH Follow up 3 months      Shared Decision Making/Informed Consent The risks [stroke, cardiac arrhythmias rarely resulting in the need for a temporary or permanent pacemaker, skin irritation or burns, esophageal damage, perforation (1:10,000 risk), bleeding, pharyngeal hematoma as well as other potential complications associated with conscious sedation including aspiration, arrhythmia, respiratory failure and death], benefits (treatment guidance, restoration of normal sinus rhythm, diagnostic support) and alternatives of a transesophageal echocardiogram guided cardioversion were discussed in detail with Cassandra Hicks and she is willing to proceed.      Medication Adjustments/Labs and Tests Ordered: Current medicines are reviewed at length with the patient today.  Concerns regarding medicines are outlined above.  No orders of the defined types were placed in this encounter.   No orders of the defined types were  placed in this encounter.     There are no Patient Instructions on file for this visit.    Signed, Maisie Fus, MD  03/25/2023 8:35 AM    [facility]       For TEE/DCCV; on xarelto; no changes. Olga Millers

## 2023-03-28 NOTE — Anesthesia Postprocedure Evaluation (Signed)
Anesthesia Post Note  Patient: Cassandra Hicks  Procedure(s) Performed: TRANSESOPHAGEAL ECHOCARDIOGRAM CARDIOVERSION     Patient location during evaluation: PACU Anesthesia Type: MAC Level of consciousness: awake and alert Pain management: pain level controlled Vital Signs Assessment: post-procedure vital signs reviewed and stable Respiratory status: spontaneous breathing, nonlabored ventilation and respiratory function stable Cardiovascular status: blood pressure returned to baseline Postop Assessment: no apparent nausea or vomiting Anesthetic complications: no   No notable events documented.  Last Vitals:  Vitals:   03/28/23 1240 03/28/23 1250  BP: 109/71 116/70  Pulse: 60 65  Resp: 16 20  Temp:    SpO2: 98% 96%    Last Pain:  Vitals:   03/28/23 1250  TempSrc:   PainSc: 0-No pain                 Shanda Howells

## 2023-03-28 NOTE — Progress Notes (Signed)
     Transesophageal Echocardiogram Note  Cassandra Hicks [id number] [birth year]  Procedure: Transesophageal Echocardiogram Indications: Atrial fibrillation   Procedure Details Consent: Obtained Time Out: Verified patient identification, verified procedure, site/side was marked, verified correct patient position, special equipment/implants available, Radiology Safety Procedures followed,  medications/allergies/relevent history reviewed, required imaging and test results available.  Performed  Medications:  Pt sedated by anesthesia with lidocaine 80 mg and diprovan 227 mg IV total.  Normal LV function; biatrial enlargement; no LAA thrombus; mild MR and TR. Pt subsequently underwent DCCV with 200J to sinus rhythm with pacs.   Complications: No apparent complications Patient did tolerate procedure well.  Olga Millers, MD

## 2023-03-28 NOTE — Interval H&P Note (Signed)
History and Physical Interval Note:  03/28/2023 10:55 AM  Cassandra Hicks  has presented today for surgery, with the diagnosis of ATRIAL FIB.  The various methods of treatment have been discussed with the patient and family. After consideration of risks, benefits and other options for treatment, the patient has consented to  Procedure(s): TRANSESOPHAGEAL ECHOCARDIOGRAM (N/A) CARDIOVERSION (N/A) as a surgical intervention.  The patient's history has been reviewed, patient examined, no change in status, stable for surgery.  I have reviewed the patient's chart and labs.  Questions were answered to the patient's satisfaction.     Olga Millers

## 2023-03-28 NOTE — Transfer of Care (Signed)
Immediate Anesthesia Transfer of Care Note  Patient: Cassandra Hicks  Procedure(s) Performed: TRANSESOPHAGEAL ECHOCARDIOGRAM CARDIOVERSION  Patient Location: Cath Lab  Anesthesia Type:MAC  Level of Consciousness: awake and alert   Airway & Oxygen Therapy: Patient Spontanous Breathing and Patient connected to nasal cannula oxygen  Post-op Assessment: Report given to RN and Post -op Vital signs reviewed and stable  Post vital signs: Reviewed and stable  Last Vitals:  Vitals Value Taken Time  BP    Temp    Pulse    Resp    SpO2      Last Pain:  Vitals:   03/28/23 1107  TempSrc:   PainSc: 0-No pain         Complications: No notable events documented.

## 2023-03-28 NOTE — Discharge Instructions (Signed)

## 2023-03-28 NOTE — Anesthesia Preprocedure Evaluation (Addendum)
Anesthesia Evaluation  Patient identified by MRN, date of birth, ID band Patient awake    Reviewed: Allergy & Precautions, NPO status , Patient's Chart, lab work & pertinent test results, reviewed documented beta blocker date and time   History of Anesthesia Complications Negative for: history of anesthetic complications  Airway Mallampati: II  TM Distance: >3 FB Neck ROM: Full    Dental  (+) Missing,    Pulmonary former smoker   Pulmonary exam normal        Cardiovascular hypertension, Pt. on medications and Pt. on home beta blockers Normal cardiovascular exam+ dysrhythmias (on Xarelto) Atrial Fibrillation      Neuro/Psych   Anxiety     negative neurological ROS     GI/Hepatic Neg liver ROS,GERD  ,,  Endo/Other  negative endocrine ROS    Renal/GU negative Renal ROS  negative genitourinary   Musculoskeletal negative musculoskeletal ROS (+)    Abdominal   Peds  Hematology negative hematology ROS (+)   Anesthesia Other Findings Day of surgery medications reviewed with patient.  Reproductive/Obstetrics                             Anesthesia Physical Anesthesia Plan  ASA: 3  Anesthesia Plan: MAC   Post-op Pain Management: Minimal or no pain anticipated   Induction:   PONV Risk Score and Plan: 2 and Treatment may vary due to age or medical condition and Propofol infusion  Airway Management Planned: Natural Airway and Nasal Cannula  Additional Equipment: None  Intra-op Plan:   Post-operative Plan:   Informed Consent: I have reviewed the patients History and Physical, chart, labs and discussed the procedure including the risks, benefits and alternatives for the proposed anesthesia with the patient or authorized representative who has indicated his/her understanding and acceptance.       Plan Discussed with: CRNA  Anesthesia Plan Comments:        Anesthesia Quick  Evaluation

## 2023-03-28 NOTE — Anesthesia Procedure Notes (Signed)
Procedure Name: MAC Date/Time: 03/28/2023 12:09 PM  Performed by: Maxine Glenn, CRNAPre-anesthesia Checklist: Patient identified, Emergency Drugs available, Suction available and Patient being monitored Patient Re-evaluated:Patient Re-evaluated prior to induction Oxygen Delivery Method: Nasal cannula Airway Equipment and Method: Bite block

## 2023-03-29 ENCOUNTER — Encounter (HOSPITAL_COMMUNITY): Payer: Self-pay | Admitting: Cardiology

## 2023-04-15 ENCOUNTER — Ambulatory Visit
Admission: RE | Admit: 2023-04-15 | Discharge: 2023-04-15 | Disposition: A | Discharge status: Home / Self Care | Payer: Medicare PPO | Source: Ambulatory Visit | Attending: Family Medicine | Admitting: Family Medicine

## 2023-04-15 DIAGNOSIS — N6489 Other specified disorders of breast: Secondary | ICD-10-CM

## 2023-05-02 ENCOUNTER — Ambulatory Visit (HOSPITAL_COMMUNITY): Payer: Medicare PPO | Attending: Cardiology

## 2023-05-02 DIAGNOSIS — I4891 Unspecified atrial fibrillation: Secondary | ICD-10-CM | POA: Insufficient documentation

## 2023-05-02 LAB — ECHOCARDIOGRAM COMPLETE
Area-P 1/2: 3.53 cm2
P 1/2 time: 627 msec
S' Lateral: 3.2 cm

## 2023-06-12 NOTE — Progress Notes (Signed)
Cardiology Office Note:    Date:  06/20/2023   ID:  Cassandra Hicks, DOB Mar 24, 1956, MRN [id number]  PCP:  Dois Davenport, MD   [facility] Providers Cardiologist:  Maisie Fus, MD Cardiology APP:  Marcelino Duster, [place] {  Referring MD: Dois Davenport, MD   Chief Complaint  Patient presents with   Follow-up    PAF    History of Present Illness:    Cassandra Hicks is a 67 y.o. female with a hx of PAF, hypertension, GERD, and former tobacco abuse.  She was recently diagnosed with atrial fibrillation with her PCP.  She reported fatigue and shortness of breath.  She was anticoagulated with Xarelto and presented for TEE guided cardioversion 03/28/2023.  She was successfully converted to sinus rhythm.TEE notable for severe LAE.   Echocardiogram 04/28/2023 showed an LVEF of 55-60%, normal RV function, mild MR.  She presents today for cardioversion on follow-up.  EKG was sinus bradycardia heart rate 50. She feels much better in sinus rhythm. No dizziness or syncope - will leave her on BB. She asks about triggers. I advised to avoid heat and alcohol, she replies that she enjoys beer and sun. We also discussed home monitoring devices including apple watch and kardia  mobile.   Past Medical History:  Diagnosis Date   Abnormal mammogram of left breast 09/17/2016   Anxiety    Breast mass, left    GERD (gastroesophageal reflux disease)    mild, no meds, diet controlled   Heart murmur    as child, no problem   Hypertension    Missed ab    x 2  D&E   SVD (spontaneous vaginal delivery)    x 2    Past Surgical History:  Procedure Laterality Date   ABDOMINAL HYSTERECTOMY N/A 12/09/2014   Procedure: HYSTERECTOMY ABDOMINAL;  Surgeon: Meriel Pica, MD;  Location: [facility];  Service: Gynecology;  Laterality: N/A;   APPENDECTOMY     BREAST BIOPSY Left 08/10/2016   BREAST EXCISIONAL BIOPSY Left 09/2016   BREAST LUMPECTOMY WITH RADIOACTIVE SEED LOCALIZATION Left 09/17/2016    Procedure: LEFT BREAST LUMPECTOMY WITH RADIOACTIVE SEED LOCALIZATION;  Surgeon: Claud Kelp, MD;  Location: [facility];  Service: General;  Laterality: Left;   CARDIOVERSION N/A 03/28/2023   Procedure: CARDIOVERSION;  Surgeon: Lewayne Bunting, MD;  Location: [facility];  Service: Cardiovascular;  Laterality: N/A;   COLONOSCOPY     DIAGNOSTIC LAPAROSCOPY     DILATION AND CURETTAGE OF UTERUS     x 2 for MAB   ECTOPIC PREGNANCY SURGERY     x 2   EYE SURGERY     bilateral - Radial Keratotomy    SALPINGOOPHORECTOMY Bilateral 12/09/2014   Procedure: SALPINGO OOPHORECTOMY;  Surgeon: Meriel Pica, MD;  Location: [facility];  Service: Gynecology;  Laterality: Bilateral;   TEE WITHOUT CARDIOVERSION N/A 03/28/2023   Procedure: TRANSESOPHAGEAL ECHOCARDIOGRAM;  Surgeon: Lewayne Bunting, MD;  Location: [facility] INVASIVE [facility];  Service: Cardiovascular;  Laterality: N/A;   TONSILLECTOMY     TUBAL LIGATION     WISDOM TOOTH EXTRACTION      Current Medications: Current Meds  Medication Sig   acetaminophen (TYLENOL) 500 MG tablet Take 1,000 mg by mouth every 6 (six) hours as needed for moderate pain.   cholecalciferol (VITAMIN D3) 25 MCG (1000 UNIT) tablet Take 1,000 Units by mouth daily.   loratadine (CLARITIN) 10 MG tablet Take 10 mg by  mouth daily as needed for allergies.   losartan-hydrochlorothiazide (HYZAAR) 50-12.5 MG tablet Take 0.5 tablets by mouth daily.   metoprolol succinate (TOPROL-XL) 50 MG 24 hr tablet Take 1 tablet (50 mg total) by mouth daily. Take with or immediately following a meal.   metoprolol tartrate (LOPRESSOR) 25 MG tablet Take 25 mg by mouth daily as needed (palpitations).   Prenatal Vit-Fe Fumarate-FA (PRENATAL PO) Take 1 tablet by mouth daily.   rivaroxaban (XARELTO) 20 MG TABS tablet Take 1 tablet (20 mg total) by mouth daily with supper.     Allergies:   Penicillins   Social History   Socioeconomic History   Marital status: Married     Spouse name: Not on file   Number of children: Not on file   Years of education: Not on file   Highest education level: Not on file  Occupational History   Not on file  Tobacco Use   Smoking status: Former    Current packs/day: 0.00    Average packs/day: 1 pack/day for 30.0 years (30.0 ttl pk-yrs)    Types: Cigarettes    Start date: 12/07/1966    Quit date: 12/07/1996    Years since quitting: 26.5   Smokeless tobacco: Never  Substance and Sexual Activity   Alcohol use: Yes    Alcohol/week: 1.0 standard drink of alcohol    Types: 1 Cans of beer per week    Comment: social/weekends   Drug use: No   Sexual activity: Yes    Birth control/protection: Post-menopausal  Other Topics Concern   Not on file  Social History Narrative   Not on file   Social Determinants of Health   Financial Resource Strain: Not on file  Food Insecurity: Not on file  Transportation Needs: Not on file  Physical Activity: Not on file  Stress: Not on file  Social Connections: Not on file     Family History: The patient's family history includes COPD in her mother; Cancer in her father; Hypertension in her brother, father, and sister.  ROS:   Please see the history of present illness.     All other systems reviewed and are negative.  EKGs/Labs/Other Studies Reviewed:    The following studies were reviewed today:  TEE-DCCV 03/28/23: EF 60-65%, severe LAE DCCV to SR   Echo 05/02/23:  1. Left ventricular ejection fraction, by estimation, is 55 to 60%. The  left ventricle has normal function. The left ventricle has no regional  wall motion abnormalities. Left ventricular diastolic parameters were  normal.   2. Right ventricular systolic function is normal. The right ventricular  size is normal. There is normal pulmonary artery systolic pressure. The  estimated right ventricular systolic pressure is 20.1 mmHg.   3. The mitral valve is normal in structure. Mild mitral valve  regurgitation. No  evidence of mitral stenosis.   4. The aortic valve is tricuspid. Aortic valve regurgitation is trivial.  No aortic stenosis is present.   5. The inferior vena cava is normal in size with greater than 50%  respiratory variability, suggesting right atrial pressure of 3 mmHg.  EKG Interpretation Date/Time:  Monday June 20 2023 10:45:26 EDT Ventricular Rate:  50 PR Interval:  182 QRS Duration:  80 QT Interval:  444 QTC Calculation: 404 R Axis:   56  Text Interpretation: Sinus bradycardia When compared with ECG of 28-Mar-2023 12:41, Premature atrial complexes are no longer Present Vent. rate has decreased BY  37 BPM Nonspecific T wave abnormality no longer  evident in Anterolateral leads QT has shortened Confirmed by Micah Flesher [phone]) on 06/20/2023 11:07:00 AM    Recent Labs: 03/25/2023: Platelets 231; TSH 2.070 03/28/2023: BUN 14; Creatinine, Ser 1.20; Hemoglobin 14.6; Potassium 3.9; Sodium 143  Recent Lipid Panel    Component Value Date/Time   CHOL 199 06/05/2015 0900   TRIG 170 (H) 06/05/2015 0900   HDL 57 06/05/2015 0900   CHOLHDL 3.5 06/05/2015 0900   VLDL 34 (H) 06/05/2015 0900   LDLCALC 108 06/05/2015 0900     Risk Assessment/Calculations:    CHA2DS2-VASc Score = 3   This indicates a 3.2% annual risk of stroke. The patient's score is based upon: CHF History: 0 HTN History: 1 Diabetes History: 0 Stroke History: 0 Vascular Disease History: 0 Age Score: 1 Gender Score: 1            Physical Exam:    VS:  BP 126/74   Ht 5\' 1"  (1.549 m)   Wt 172 lb (78 kg)   SpO2 98%   BMI 32.50 kg/m     Wt Readings from Last 3 Encounters:  06/20/23 172 lb (78 kg)  03/28/23 176 lb (79.8 kg)  03/25/23 178 lb 12.8 oz (81.1 kg)     GEN:  Well nourished, well developed in no acute distress HEENT: Normal NECK: No JVD; No carotid bruits LYMPHATICS: No lymphadenopathy CARDIAC: RRR, no murmurs, rubs, gallops RESPIRATORY:  Clear to auscultation without rales, wheezing or  rhonchi  ABDOMEN: Soft, non-tender, non-distended MUSCULOSKELETAL:  No edema; No deformity  SKIN: Warm and dry NEUROLOGIC:  Alert and oriented x 3 PSYCHIATRIC:  Normal affect   ASSESSMENT:    1. Atrial fibrillation, unspecified type (HCC)   2. Nonrheumatic mitral valve regurgitation   3. Sinus bradycardia   4. Primary hypertension    PLAN:    In order of problems listed above:  PAF Sinus bradycardia TEE-DCCV 03/2023 to SR with PACs Severe LAE  Continue BB EKG today with sinus bradycardia She asks about home monitoring, we discuss apple watch vs kardiamobile   Chronic anticoagulation Continue xarelto Continue this - she is open to switching to eliquis, will check insurance   Mild MR - repeat echo in 1 year   HTN Losartan-hydrochlorothiazide 50-12.5, lopressor 25 mg BID - BP well controlled   Follow up in 6 months.   Repeat echo on 1 year.      Medication Adjustments/Labs and Tests Ordered: Current medicines are reviewed at length with the patient today.  Concerns regarding medicines are outlined above.  Orders Placed This Encounter  Procedures   EKG 12-Lead   ECHOCARDIOGRAM COMPLETE   No orders of the defined types were placed in this encounter.   Patient Instructions  Medication Instructions:  Your physician recommends that you continue on your current medications as directed. Please refer to the Current Medication list given to you today.  *If you need a refill on your cardiac medications before your next appointment, please call your pharmacy*   Testing/Procedures: Your physician has requested that you have an echocardiogram. Echocardiography is a painless test that uses sound waves to create images of your heart. It provides your doctor with information about the size and shape of your heart and how well your heart's chambers and valves are working. This procedure takes approximately one hour. There are no restrictions for this procedure. Please do  NOT wear cologne, perfume, aftershave, or lotions (deodorant is allowed). Please arrive 15 minutes prior to your appointment time. This will  take place at [address] **To do in June 2025**   Follow-Up: At [facility], you and your health needs are our priority.  As part of our continuing mission to provide you with exceptional heart care, we have created designated Provider Care Teams.  These Care Teams include your primary Cardiologist (physician) and Advanced Practice Providers (APPs -  Physician Assistants and Nurse Practitioners) who all work together to provide you with the care you need, when you need it.  We recommend signing up for the patient portal called "MyChart".  Sign up information is provided on this After Visit Summary.  MyChart is used to connect with patients for Virtual Visits (Telemedicine).  Patients are able to view lab/test results, encounter notes, upcoming appointments, etc.  Non-urgent messages Hicks be sent to your provider as well.   To learn more about what you can do with MyChart, go to [email or web address].    Your next appointment:   5-6 month(s)  Provider:   Maisie Fus, MD  or Micah Flesher, PA-C       Other Instructions  [facility] [employer]: Website: [email or web address]  Or Apple watch    Signed, Marcelino Duster, [place]  06/20/2023 12:17 PM    [facility]

## 2023-06-20 ENCOUNTER — Encounter: Payer: Self-pay | Admitting: Physician Assistant

## 2023-06-20 ENCOUNTER — Ambulatory Visit: Payer: Medicare PPO | Admitting: Physician Assistant

## 2023-06-20 ENCOUNTER — Ambulatory Visit: Payer: Medicare PPO | Admitting: Internal Medicine

## 2023-06-20 VITALS — BP 126/74 | Ht 61.0 in | Wt 172.0 lb

## 2023-06-20 DIAGNOSIS — I4891 Unspecified atrial fibrillation: Secondary | ICD-10-CM | POA: Diagnosis not present

## 2023-06-20 DIAGNOSIS — I10 Essential (primary) hypertension: Secondary | ICD-10-CM

## 2023-06-20 DIAGNOSIS — I340 Nonrheumatic mitral (valve) insufficiency: Secondary | ICD-10-CM

## 2023-06-20 DIAGNOSIS — R001 Bradycardia, unspecified: Secondary | ICD-10-CM

## 2023-06-20 NOTE — Patient Instructions (Addendum)
Medication Instructions:  Your physician recommends that you continue on your current medications as directed. Please refer to the Current Medication list given to you today.  *If you need a refill on your cardiac medications before your next appointment, please call your pharmacy*   Testing/Procedures: Your physician has requested that you have an echocardiogram. Echocardiography is a painless test that uses sound waves to create images of your heart. It provides your doctor with information about the size and shape of your heart and how well your heart's chambers and valves are working. This procedure takes approximately one hour. There are no restrictions for this procedure. Please do NOT wear cologne, perfume, aftershave, or lotions (deodorant is allowed). Please arrive 15 minutes prior to your appointment time. This will take place at [address] **To do in June 2025**   Follow-Up: At [facility], you and your health needs are our priority.  As part of our continuing mission to provide you with exceptional heart care, we have created designated Provider Care Teams.  These Care Teams include your primary Cardiologist (physician) and Advanced Practice Providers (APPs -  Physician Assistants and Nurse Practitioners) who all work together to provide you with the care you need, when you need it.  We recommend signing up for the patient portal called "MyChart".  Sign up information is provided on this After Visit Summary.  MyChart is used to connect with patients for Virtual Visits (Telemedicine).  Patients are able to view lab/test results, encounter notes, upcoming appointments, etc.  Non-urgent messages can be sent to your provider as well.   To learn more about what you can do with MyChart, go to [email or web address].    Your next appointment:   5-6 month(s)  Provider:   Maisie Fus, MD  or Micah Flesher, PA-C       Other Instructions  [facility]  [employer]: Website: [email or web address]  Or Apple watch

## 2023-09-19 ENCOUNTER — Other Ambulatory Visit: Payer: Self-pay | Admitting: Internal Medicine

## 2023-09-19 NOTE — Telephone Encounter (Signed)
Prescription refill request for Xarelto received.  Indication:afib Last office visit:8/24 Weight:78  kg Age:67 Scr:1.20  5/24 CrCl:57.55  ml/min  Prescription refilled

## 2023-09-27 ENCOUNTER — Other Ambulatory Visit: Payer: Self-pay | Admitting: Family Medicine

## 2023-09-27 DIAGNOSIS — Z1231 Encounter for screening mammogram for malignant neoplasm of breast: Secondary | ICD-10-CM

## 2023-10-19 ENCOUNTER — Other Ambulatory Visit: Payer: Self-pay | Admitting: Family Medicine

## 2023-10-19 DIAGNOSIS — N6489 Other specified disorders of breast: Secondary | ICD-10-CM

## 2023-10-24 ENCOUNTER — Ambulatory Visit
Admission: RE | Admit: 2023-10-24 | Discharge: 2023-10-24 | Disposition: A | Discharge status: Home / Self Care | Payer: Medicare PPO | Source: Ambulatory Visit | Attending: Family Medicine | Admitting: Family Medicine

## 2023-10-24 DIAGNOSIS — N6489 Other specified disorders of breast: Secondary | ICD-10-CM

## 2023-10-26 ENCOUNTER — Ambulatory Visit: Payer: Medicare PPO

## 2023-12-20 ENCOUNTER — Telehealth: Payer: Self-pay

## 2023-12-20 NOTE — Telephone Encounter (Signed)
   Pre-operative Risk Assessment    Patient Name: Cassandra Hicks  DOB: Dec 14, 1955 MRN: [id number]   Date of last office visit: 06/20/23 Date of next office visit: 12/27/23   Request for Surgical Clearance    Procedure:   Colonoscopy   Date of Surgery:  Clearance 02/01/24                                 Surgeon:  Dr. Phillips Climes Group or Practice Name:  [facility], [place]  Phone number:  [phone] Fax number:  [phone]   Type of Clearance Requested:   - Medical  - Pharmacy:  Hold Rivaroxaban (Xarelto) Not indicated    Type of Anesthesia:   Propofol    Additional requests/questions:    Vance Peper   12/20/2023, 4:09 PM

## 2023-12-22 NOTE — Telephone Encounter (Signed)
Patient with diagnosis of PAF on Xarelto for anticoagulation.    Procedure: Colonoscopy Date of procedure: 02/01/2024   CHA2DS2-VASc Score = 3   This indicates a 3.2% annual risk of stroke. The patient's score is based upon: CHF History: 0 HTN History: 1 Diabetes History: 0 Stroke History: 0 Vascular Disease History: 0 Age Score: 1 Gender Score: 1     CrCl -- Platelet count --  Per office protocol, patient can hold Xarelto  for -- days prior to procedure.    **This guidance is not considered finalized until pre-operative APP has relayed final recommendations.**

## 2023-12-22 NOTE — Telephone Encounter (Signed)
Pt has appt in office to see Dr. Wyline Mood 12/27/23. Per preop APP  and Pharm-D the pt needs labs for preop clearance. I will add needs labs to appt notes as well as need preop clearance.   I will update all parties involved.   Labs have NOT been placed yet as MD may have additional labs to order as well.

## 2023-12-27 ENCOUNTER — Encounter: Payer: Self-pay | Admitting: Internal Medicine

## 2023-12-27 ENCOUNTER — Ambulatory Visit: Payer: Medicare PPO | Attending: Internal Medicine | Admitting: Internal Medicine

## 2023-12-27 VITALS — BP 128/88 | HR 57 | Ht 61.0 in | Wt 178.9 lb

## 2023-12-27 DIAGNOSIS — I4891 Unspecified atrial fibrillation: Secondary | ICD-10-CM | POA: Diagnosis not present

## 2023-12-27 NOTE — Patient Instructions (Signed)
 Medication Instructions:  Continue same medications *If you need a refill on your cardiac medications before your next appointment, please call your pharmacy*   Lab Work: None ordered   Testing/Procedures: None ordered   Follow-Up: At [facility], you and your health needs are our priority.  As part of our continuing mission to provide you with exceptional heart care, we have created designated Provider Care Teams.  These Care Teams include your primary Cardiologist (physician) and Advanced Practice Providers (APPs -  Physician Assistants and Nurse Practitioners) who all work together to provide you with the care you need, when you need it.  We recommend signing up for the patient portal called "MyChart".  Sign up information is provided on this After Visit Summary.  MyChart is used to connect with patients for Virtual Visits (Telemedicine).  Patients are able to view lab/test results, encounter notes, upcoming appointments, etc.  Non-urgent messages can be sent to your provider as well.   To learn more about what you can do with MyChart, go to [email or web address].    Your next appointment:  6 months    Call in April to schedule August appointment     Provider:  PA

## 2023-12-27 NOTE — Progress Notes (Signed)
 Cardiology Office Note:    Date:  12/27/2023   ID:  Cassandra Hicks, DOB 03-27-1956, MRN [id number]  PCP:  Dois Davenport, MD   [facility] Providers Cardiologist:  Maisie Fus, MD Cardiology APP:  Marcelino Duster, [place] {  Referring MD: Dois Davenport, MD   No chief complaint on file.   History of Present Illness:    Cassandra Hicks is a 68 y.o. female with a hx of PAF, hypertension, GERD, and former tobacco abuse.  She was recently diagnosed with atrial fibrillation with her PCP.  She reported fatigue and shortness of breath.  She was anticoagulated with Xarelto and presented for TEE guided cardioversion 03/28/2023.  She was successfully converted to sinus rhythm.TEE notable for severe LAE.   Echocardiogram 04/28/2023 showed an LVEF of 55-60%, normal RV function, mild MR.  She presents today for cardioversion on follow-up.  EKG was sinus bradycardia heart rate 50. She feels much better in sinus rhythm. No dizziness or syncope - will leave her on BB. She asks about triggers. I advised to avoid heat and alcohol, she replies that she enjoys beer and sun. We also discussed home monitoring devices including apple watch and kardia  mobile.  Interim hx 12/27/2023 Cassandra Hicks is doing well today.  She notes her symptoms are much improved since her cardioversion.  She is planned for colonoscopy and a preop was requested.  She is asymptomatic  Past Medical History:  Diagnosis Date   Abnormal mammogram of left breast 09/17/2016   Anxiety    Breast mass, left    GERD (gastroesophageal reflux disease)    mild, no meds, diet controlled   Heart murmur    as child, no problem   Hypertension    Missed ab    x 2  D&E   SVD (spontaneous vaginal delivery)    x 2    Past Surgical History:  Procedure Laterality Date   ABDOMINAL HYSTERECTOMY N/A 12/09/2014   Procedure: HYSTERECTOMY ABDOMINAL;  Surgeon: Meriel Pica, MD;  Location: [facility];  Service: Gynecology;  Laterality: N/A;    APPENDECTOMY     BREAST BIOPSY Left 08/10/2016   BREAST EXCISIONAL BIOPSY Left 09/2016   BREAST LUMPECTOMY WITH RADIOACTIVE SEED LOCALIZATION Left 09/17/2016   Procedure: LEFT BREAST LUMPECTOMY WITH RADIOACTIVE SEED LOCALIZATION;  Surgeon: Claud Kelp, MD;  Location: [facility];  Service: General;  Laterality: Left;   CARDIOVERSION N/A 03/28/2023   Procedure: CARDIOVERSION;  Surgeon: Lewayne Bunting, MD;  Location: [facility];  Service: Cardiovascular;  Laterality: N/A;   COLONOSCOPY     DIAGNOSTIC LAPAROSCOPY     DILATION AND CURETTAGE OF UTERUS     x 2 for MAB   ECTOPIC PREGNANCY SURGERY     x 2   EYE SURGERY     bilateral - Radial Keratotomy    SALPINGOOPHORECTOMY Bilateral 12/09/2014   Procedure: SALPINGO OOPHORECTOMY;  Surgeon: Meriel Pica, MD;  Location: [facility];  Service: Gynecology;  Laterality: Bilateral;   TEE WITHOUT CARDIOVERSION N/A 03/28/2023   Procedure: TRANSESOPHAGEAL ECHOCARDIOGRAM;  Surgeon: Lewayne Bunting, MD;  [facility]: [facility] CV [facility];  Service: Cardiovascular;  Laterality: N/A;   TONSILLECTOMY     TUBAL LIGATION     WISDOM TOOTH EXTRACTION      Current Medications: Current Meds  Medication Sig   acetaminophen (TYLENOL) 500 MG tablet Take 1,000 mg by mouth every 6 (six) hours as needed for moderate pain.  cholecalciferol (VITAMIN D3) 25 MCG (1000 UNIT) tablet Take 1,000 Units by mouth daily.   loratadine (CLARITIN) 10 MG tablet Take 10 mg by mouth daily as needed for allergies.   losartan-hydrochlorothiazide (HYZAAR) 50-12.5 MG tablet Take 0.5 tablets by mouth daily.   metoprolol succinate (TOPROL-XL) 50 MG 24 hr tablet Take 1 tablet (50 mg total) by mouth daily. Take with or immediately following a meal.   metoprolol tartrate (LOPRESSOR) 25 MG tablet Take 25 mg by mouth daily as needed (palpitations).   Prenatal Vit-Fe Fumarate-FA (PRENATAL PO) Take 1 tablet by mouth daily.   rosuvastatin (CRESTOR) 10 MG tablet Take  10 mg by mouth daily.   XARELTO 20 MG TABS tablet TAKE 1 TABLET BY MOUTH DAILY WITH SUPPER.     Allergies:   Penicillins   Social History   Socioeconomic History   Marital status: Married    Spouse name: Not on file   Number of children: Not on file   Years of education: Not on file   Highest education level: Not on file  Occupational History   Not on file  Tobacco Use   Smoking status: Former    Current packs/day: 0.00    Average packs/day: 1 pack/day for 30.0 years (30.0 ttl pk-yrs)    Types: Cigarettes    Start date: 12/07/1966    Quit date: 12/07/1996    Years since quitting: 27.0   Smokeless tobacco: Never  Substance and Sexual Activity   Alcohol use: Yes    Alcohol/week: 1.0 standard drink of alcohol    Types: 1 Cans of beer per week    Comment: social/weekends   Drug use: No   Sexual activity: Yes    Birth control/protection: Post-menopausal  Other Topics Concern   Not on file  Social History Narrative   Not on file   Social Drivers of Health   Financial Resource Strain: Not on file  Food Insecurity: Not on file  Transportation Needs: Not on file  Physical Activity: Not on file  Stress: Not on file  Social Connections: Not on file     Family History: The patient's family history includes COPD in her mother; Cancer in her father; Hypertension in her brother, father, and sister.  ROS:   Please see the history of present illness.     All other systems reviewed and are negative.  EKGs/Labs/Other Studies Reviewed:    The following studies were reviewed today:  TEE-DCCV 03/28/23: EF 60-65%, severe LAE DCCV to SR   Echo 05/02/23:  1. Left ventricular ejection fraction, by estimation, is 55 to 60%. The  left ventricle has normal function. The left ventricle has no regional  wall motion abnormalities. Left ventricular diastolic parameters were  normal.   2. Right ventricular systolic function is normal. The right ventricular  size is normal. There is  normal pulmonary artery systolic pressure. The  estimated right ventricular systolic pressure is 20.1 mmHg.   3. The mitral valve is normal in structure. Mild mitral valve  regurgitation. No evidence of mitral stenosis.   4. The aortic valve is tricuspid. Aortic valve regurgitation is trivial.  No aortic stenosis is present.   5. The inferior vena cava is normal in size with greater than 50%  respiratory variability, suggesting right atrial pressure of 3 mmHg.       Recent Labs: 03/25/2023: Platelets 231; TSH 2.070 03/28/2023: BUN 14; Creatinine, Ser 1.20; Hemoglobin 14.6; Potassium 3.9; Sodium 143  Recent Lipid Panel    Component Value  Date/Time   CHOL 199 06/05/2015 0900   TRIG 170 (H) 06/05/2015 0900   HDL 57 06/05/2015 0900   CHOLHDL 3.5 06/05/2015 0900   VLDL 34 (H) 06/05/2015 0900   LDLCALC 108 06/05/2015 0900     Risk Assessment/Calculations:    CHA2DS2-VASc Score = 3   This indicates a 3.2% annual risk of stroke. The patient's score is based upon: CHF History: 0 HTN History: 1 Diabetes History: 0 Stroke History: 0 Vascular Disease History: 0 Age Score: 1 Gender Score: 1        Physical Exam:    VS:   Vitals:   12/27/23 1315  BP: 128/88  Pulse: (!) 57  SpO2: 98%     Wt Readings from Last 3 Encounters:  06/20/23 172 lb (78 kg)  03/28/23 176 lb (79.8 kg)  03/25/23 178 lb 12.8 oz (81.1 kg)     GEN:  Well nourished, well developed in no acute distress HEENT: Normal NECK: No JVD; No carotid bruits LYMPHATICS: No lymphadenopathy CARDIAC: RRR, no murmurs, rubs, gallops RESPIRATORY:  Clear to auscultation without rales, wheezing or rhonchi  ABDOMEN: Soft, non-tender, non-distended MUSCULOSKELETAL:  No edema; No deformity  SKIN: Warm and dry NEUROLOGIC:  Alert and oriented x 3 PSYCHIATRIC:  Normal affect   ASSESSMENT:    1. Atrial fibrillation, unspecified type (HCC)    PLAN:    In order of problems listed above:  PreOp: Can be directed via  mychart. She is in sinus bradycardic rhythm. She is asymptomatic. She can do >4 METS. She is acceptable cardiac risk for low risk procedure. She can hold xarelto for 3-5 days prior to her procedure and restart per GI.  PAF Sinus bradycardia TEE-DCCV 03/2023 to SR with PACs Continue BB Angie discussed home monitoring with her prior She notes that she had some snoring; she will reach out if she is interested in doing a sleep study  Chronic anticoagulation Continue xarelto Continue this    Mild MR -continue surveillance   HTN Losartan-hydrochlorothiazide 50-12.5, lopressor 25 mg BID - BP well controlled   Follow up in 6 months with an APP  Medication Adjustments/Labs and Tests Ordered: Current medicines are reviewed at length with the patient today.  Concerns regarding medicines are outlined above.  Orders Placed This Encounter  Procedures   EKG 12-Lead   No orders of the defined types were placed in this encounter.   There are no Patient Instructions on file for this visit.   Signed, Maisie Fus, MD  12/27/2023 1:19 PM    [facility]

## 2023-12-29 NOTE — Telephone Encounter (Signed)
 Labs not drawn at MD appt.  Does have labs from 03/2023, within a year, so will use those (still within 1 year)  CrCl  60 Platelets   231  Okay to hold Xarelto for 2 days prior to colonoscopy

## 2023-12-29 NOTE — Telephone Encounter (Signed)
   Patient Name: Cassandra Hicks  DOB: 1956-09-18 MRN: [id number]  Primary Cardiologist: Maisie Fus, MD  Chart reviewed as part of pre-operative protocol coverage. Given past medical history and time since last visit, based on ACC/AHA guidelines, BIRTIE FELLMAN is at acceptable risk for the planned procedure without further cardiovascular testing.   PreOp: Can be directed via mychart. She is in sinus bradycardic rhythm. She is asymptomatic. She can do >4 METS. She is acceptable cardiac risk for low risk procedure. She can hold xarelto for 3-5 days prior to her procedure and restart per GI.-Dr. Dina Rich   The patient was advised that if she develops new symptoms prior to surgery to contact our office to arrange for a follow-up visit, and she verbalized understanding.  I will route this recommendation to the requesting party via Epic fax function and remove from pre-op pool.  Please call with questions.  Joni Reining, NP 12/29/2023, 11:37 AM

## 2024-04-05 ENCOUNTER — Telehealth (HOSPITAL_COMMUNITY): Payer: Self-pay | Admitting: Physician Assistant

## 2024-04-05 DIAGNOSIS — I340 Nonrheumatic mitral (valve) insufficiency: Secondary | ICD-10-CM

## 2024-04-05 NOTE — Telephone Encounter (Signed)
 I called to schedule ordered echocardiogram per Marcie Sever, PA .Spoke with patient and she declined to schedule and she will follow up with Cardiologist an then if she decides to schedule she weill. LBW 1:14  Order will be removed from the echo WQ. Thank you.

## 2024-06-14 NOTE — Telephone Encounter (Signed)
 Spoke with pt, it was recommended that she have a repeat echo in 1 year due to MR. Echo scheduled prior to follow up appointment.

## 2024-06-14 NOTE — Addendum Note (Signed)
 Addended by: Hailyn Zarr W on: 06/14/2024 03:09 PM   Modules accepted: Orders

## 2024-06-14 NOTE — Telephone Encounter (Signed)
 Pt would like to know if the Echo is still needed before her upcoming appt.

## 2024-06-19 ENCOUNTER — Telehealth: Payer: Self-pay | Admitting: Physician Assistant

## 2024-06-19 NOTE — Telephone Encounter (Signed)
*  STAT* If patient is at the pharmacy, call can be transferred to refill team.   1. Which medications need to be refilled? (please list name of each medication and dose if known) XARELTO  20 MG TABS tablet    2. Would you like to learn more about the convenience, safety, & potential cost savings by using the [facility]?    3. Are you open to using the [facility] (Type [facility].  ).   4. Which pharmacy/location (including street and city if local pharmacy) is medication to be sent to? [id number] - [place], [place] - [address]    5. Do they need a 30 day or 90 day supply? 90 day

## 2024-06-20 ENCOUNTER — Ambulatory Visit (HOSPITAL_COMMUNITY)
Admission: RE | Admit: 2024-06-20 | Discharge: 2024-06-20 | Disposition: A | Discharge status: Home / Self Care | Source: Ambulatory Visit | Attending: Cardiology | Admitting: Cardiology

## 2024-06-20 DIAGNOSIS — I340 Nonrheumatic mitral (valve) insufficiency: Secondary | ICD-10-CM | POA: Insufficient documentation

## 2024-06-20 LAB — ECHOCARDIOGRAM COMPLETE: S' Lateral: 2.6 cm

## 2024-06-20 MED ORDER — RIVAROXABAN 20 MG PO TABS: 20.0000 mg | ORAL_TABLET | Freq: Every day | ORAL | 5 refills | Status: AC

## 2024-06-20 NOTE — Telephone Encounter (Signed)
 Labs received from PCP Creat 1.03 on 02/14/24, age 68, weight 81.1kg, CrCl 68.79, based on CrCl pt is on appropriate dosage of Xarelto  20mg  every day for afib.  Will refill rx.

## 2024-06-20 NOTE — Telephone Encounter (Addendum)
 Pt last saw Dr Alvan 12/27/23, last labs 03/28/23 Creat 1.20, pt is overdue for labwork.  Age 68, weight 81.1kg, called PCP to request most recent labwork done 02/2024.  Will await faxed lab results.

## 2024-06-22 ENCOUNTER — Ambulatory Visit: Payer: Self-pay | Admitting: Physician Assistant

## 2024-06-22 ENCOUNTER — Telehealth: Payer: Self-pay | Admitting: Physician Assistant

## 2024-06-22 NOTE — Telephone Encounter (Signed)
 Pt would like a c/b regarding possibly being in Afib on 8/13 when she came to have Echo. Pt states that tech told her that she was in Afib at the time. Please advise

## 2024-06-22 NOTE — Telephone Encounter (Signed)
 Spoke to patient she stated she has been feeling bad,no energy,sob for the past couple of weeks.Pulse 153 to 90.Stated when she had a recent echo tech told her she was in afib.Appointment scheduled with afib clinic Monday 8/18 at 10:00 am.Advised if symptoms worsen she will need to go to ED.

## 2024-06-25 ENCOUNTER — Encounter (HOSPITAL_COMMUNITY): Payer: Self-pay | Admitting: Physician Assistant

## 2024-06-25 ENCOUNTER — Ambulatory Visit (HOSPITAL_COMMUNITY)
Admission: RE | Admit: 2024-06-25 | Discharge: 2024-06-25 | Disposition: A | Discharge status: Home / Self Care | Source: Ambulatory Visit | Attending: Physician Assistant | Admitting: Physician Assistant

## 2024-06-25 VITALS — BP 122/102 | HR 138 | Ht 61.0 in | Wt 178.2 lb

## 2024-06-25 DIAGNOSIS — I484 Atypical atrial flutter: Secondary | ICD-10-CM | POA: Diagnosis not present

## 2024-06-25 DIAGNOSIS — I4891 Unspecified atrial fibrillation: Secondary | ICD-10-CM | POA: Diagnosis not present

## 2024-06-25 DIAGNOSIS — D6869 Other thrombophilia: Secondary | ICD-10-CM

## 2024-06-25 NOTE — Patient Instructions (Signed)
 Increase metoprolol  to 50mg  twice a day until day of cardioversion then return to normal dosing.     Cardioversion scheduled for: Monday, August 25th   - Arrive at the [facility] ([address])  and check in with ADMITTING at 12pm   - Do not eat or drink anything after midnight the night prior to your procedure.   - Take all your morning medication (except diabetic medications) with a sip of water prior to arrival.  - Do NOT miss any doses of your blood thinner - if you should miss a dose or take a dose more than 4 hours late -- please notify our office immediately.  - You will not be able to drive home after your procedure. Please ensure you have a responsible adult to drive you home. You will need someone with you for 24 hours post procedure.     - Expect to be in the procedural area approximately 2 hours.   - If you feel as if you go back into normal rhythm prior to scheduled cardioversion, please notify our office immediately.   If your procedure is canceled in the cardioversion suite you will be charged a cancellation fee.

## 2024-06-25 NOTE — Progress Notes (Signed)
 Primary Care Physician: Burney Darice CROME, MD Primary Cardiologist: Alvan Ronal BRAVO, MD (Inactive) Electrophysiologist: None  Referring Physician: HeartCare triage    Cassandra Hicks is a 68 y.o. female with a history of HTN, previous tobacco use, atrial fibrillation who presents for follow up in the [facility].  The patient was initially diagnosed with atrial fibrillation early 2024 by her PCP. She was seen by Dr Ronal Alvan and underwent TEE/DCCV on 03/28/23. Patient is on Xarelto  for stroke prevention.    Patient presents today for follow up for atrial fibrillation. She had an echocardiogram on 06/20/24 which showed normal LVEF, mild MR. She was noted to be in afib at that time. She has had symptoms of SOB for the past 2 weeks but did not attribute that to afib until now. No bleeding issues on anticoagulation.   Today, she denies symptoms of palpitations, chest pain, orthopnea, PND, lower extremity edema, dizziness, presyncope, syncope, snoring, daytime somnolence, bleeding, or neurologic sequela. The patient is tolerating medications without difficulties and is otherwise without complaint today.    Atrial Fibrillation Risk Factors:  she does not have symptoms or diagnosis of sleep apnea. she does not have a history of rheumatic fever. she does have a history of alcohol use. The patient does have a history of early familial atrial fibrillation or other arrhythmias. Brother and two sisters have afib.   Atrial Fibrillation Management history:  Previous antiarrhythmic drugs: none Previous cardioversions: 03/28/23 Previous ablations: none Anticoagulation history: Xarelto    ROS- All systems are reviewed and negative except as per the HPI above.  Past Medical History:  Diagnosis Date   Abnormal mammogram of left breast 09/17/2016   Anxiety    Breast mass, left    GERD (gastroesophageal reflux disease)    mild, no meds, diet controlled   Heart murmur    as  child, no problem   Hypertension    Missed ab    x 2  D&E   SVD (spontaneous vaginal delivery)    x 2    Current Outpatient Medications  Medication Sig Dispense Refill   acetaminophen  (TYLENOL ) 500 MG tablet Take 1,000 mg by mouth every 6 (six) hours as needed for moderate pain.     cholecalciferol (VITAMIN D3) 25 MCG (1000 UNIT) tablet Take 1,000 Units by mouth daily.     loratadine (CLARITIN) 10 MG tablet Take 10 mg by mouth daily as needed for allergies.     losartan-hydrochlorothiazide  (HYZAAR) 50-12.5 MG tablet Take 0.5 tablets by mouth daily. (Patient taking differently: Take 1 tablet by mouth daily.)     metoprolol  succinate (TOPROL -XL) 50 MG 24 hr tablet Take 1 tablet (50 mg total) by mouth daily. Take with or immediately following a meal. 90 tablet 2   metoprolol  tartrate (LOPRESSOR ) 25 MG tablet Take 25 mg by mouth daily as needed (palpitations).     Prenatal Vit-Fe Fumarate-FA (PRENATAL PO) Take 1 tablet by mouth daily.     rivaroxaban  (XARELTO ) 20 MG TABS tablet Take 1 tablet (20 mg total) by mouth daily with supper. 30 tablet 5   rosuvastatin (CRESTOR) 10 MG tablet Take 10 mg by mouth daily.     No current facility-administered medications for this encounter.    Physical Exam: BP (!) 122/102   Pulse (!) 138   Ht 5' 1 (1.549 m)   Wt 80.8 kg   BMI 33.67 kg/m   GEN: Well nourished, well developed in no acute distress NECK: No  JVD CARDIAC: Irregularly irregular rate and rhythm, no murmurs, rubs, gallops RESPIRATORY:  Clear to auscultation without rales, wheezing or rhonchi  ABDOMEN: Soft, non-tender, non-distended EXTREMITIES:  No edema; No deformity   Wt Readings from Last 3 Encounters:  06/25/24 80.8 kg  12/27/23 81.1 kg  06/20/23 78 kg     EKG today demonstrates  Atypical atrial flutter with variable block vs coarse afib Vent. rate 138 BPM PR interval * ms QRS duration 74 ms QT/QTcB 324/490 ms   Echo 06/20/24 demonstrated   1. Left ventricular ejection  fraction, by estimation, is 55 to 60%. The  left ventricle has normal function. The left ventricle has no regional  wall motion abnormalities. Left ventricular diastolic function could not  be evaluated.   2. Right ventricular systolic function is normal. The right ventricular  size is normal.   3. Left atrial size was mildly dilated.   4. The mitral valve is normal in structure. Mild mitral valve  regurgitation. No evidence of mitral stenosis.   5. The aortic valve is tricuspid. Aortic valve regurgitation is trivial.  No aortic stenosis is present.   6. The inferior vena cava is normal in size with greater than 50%  respiratory variability, suggesting right atrial pressure of 3 mmHg.    CHA2DS2-VASc Score = 3  The patient's score is based upon: CHF History: 0 HTN History: 1 Diabetes History: 0 Stroke History: 0 Vascular Disease History: 0 Age Score: 1 Gender Score: 1       ASSESSMENT AND PLAN: Persistent Atrial Fibrillation (ICD10:  I48.19) The patient's CHA2DS2-VASc score is 3, indicating a 3.2% annual risk of stroke.   Patient back in persistent afib with elevated rates. We discussed rhythm control options today. Will plan for DCCV. Check bmet/cbc today.  Increase Toprol  to 50 mg BID until DCCV then decrease back to once daily.  Continue Lopressor  25 mg PRN for heart racing. Continue Xarelto  20 mg daily  Secondary Hypercoagulable State (ICD10:  D68.69) The patient is at significant risk for stroke/thromboembolism based upon her CHA2DS2-VASc Score of 3.  Continue Rivaroxaban  (Xarelto ). No bleeding issues.   HTN Stable on current regimen    Follow up with Jon Hails as scheduled and then in the AF clinic in 3 months.   Informed Consent   Shared Decision Making/Informed Consent The risks (stroke, cardiac arrhythmias rarely resulting in the need for a temporary or permanent pacemaker, skin irritation or burns and complications associated with conscious sedation  including aspiration, arrhythmia, respiratory failure and death), benefits (restoration of normal sinus rhythm) and alternatives of a direct current cardioversion were explained in detail to Ms. Dhingra and she agrees to proceed.         [facility] [address] [place], [place] [address] [phone]

## 2024-06-25 NOTE — H&P (View-Only) (Signed)
 Primary Care Physician: Cassandra Darice CROME, MD Primary Cardiologist: Cassandra Cassandra BRAVO, MD (Inactive) Electrophysiologist: None  Referring Physician: [facility]    Cassandra Hicks is a 68 y.o. female with a history of HTN, previous tobacco use, atrial fibrillation who presents for follow up in the [facility].  The patient was initially diagnosed with atrial fibrillation early 2024 by her PCP. She was seen by Dr Cassandra Hicks and underwent TEE/DCCV on 03/28/23. Patient is on Xarelto  for stroke prevention.    Patient presents today for follow up for atrial fibrillation. She had an echocardiogram on 06/20/24 which showed normal LVEF, mild MR. She was noted to be in afib at that time. She has had symptoms of SOB for the past 2 weeks but did not attribute that to afib until now. No bleeding issues on anticoagulation.   Today, she denies symptoms of palpitations, chest pain, orthopnea, PND, lower extremity edema, dizziness, presyncope, syncope, snoring, daytime somnolence, bleeding, or neurologic sequela. The patient is tolerating medications without difficulties and is otherwise without complaint today.    Atrial Fibrillation Risk Factors:  she does not have symptoms or diagnosis of sleep apnea. she does not have a history of rheumatic fever. she does have a history of alcohol use. The patient does have a history of early familial atrial fibrillation or other arrhythmias. Brother and two sisters have afib.   Atrial Fibrillation Management history:  Previous antiarrhythmic drugs: none Previous cardioversions: 03/28/23 Previous ablations: none Anticoagulation history: Xarelto    ROS- All systems are reviewed and negative except as per the HPI above.  Past Medical History:  Diagnosis Date   Abnormal mammogram of left breast 09/17/2016   Anxiety    Breast mass, left    GERD (gastroesophageal reflux disease)    mild, no meds, diet controlled   Heart murmur    as  child, no problem   Hypertension    Missed ab    x 2  D&E   SVD (spontaneous vaginal delivery)    x 2    Current Outpatient Medications  Medication Sig Dispense Refill   acetaminophen  (TYLENOL ) 500 MG tablet Take 1,000 mg by mouth every 6 (six) hours as needed for moderate pain.     cholecalciferol (VITAMIN D3) 25 MCG (1000 UNIT) tablet Take 1,000 Units by mouth daily.     loratadine (CLARITIN) 10 MG tablet Take 10 mg by mouth daily as needed for allergies.     losartan-hydrochlorothiazide  (HYZAAR) 50-12.5 MG tablet Take 0.5 tablets by mouth daily. (Patient taking differently: Take 1 tablet by mouth daily.)     metoprolol  succinate (TOPROL -XL) 50 MG 24 hr tablet Take 1 tablet (50 mg total) by mouth daily. Take with or immediately following a meal. 90 tablet 2   metoprolol  tartrate (LOPRESSOR ) 25 MG tablet Take 25 mg by mouth daily as needed (palpitations).     Prenatal Vit-Fe Fumarate-FA (PRENATAL PO) Take 1 tablet by mouth daily.     rivaroxaban  (XARELTO ) 20 MG TABS tablet Take 1 tablet (20 mg total) by mouth daily with supper. 30 tablet 5   rosuvastatin (CRESTOR) 10 MG tablet Take 10 mg by mouth daily.     No current facility-administered medications for this encounter.    Physical Exam: BP (!) 122/102   Pulse (!) 138   Ht 5' 1 (1.549 Cassandra Hicks)   Wt 80.8 kg   BMI 33.67 kg/Cassandra Hicks   GEN: Well nourished, well developed in no acute distress NECK: No  JVD CARDIAC: Irregularly irregular rate and rhythm, no murmurs, rubs, gallops RESPIRATORY:  Clear to auscultation without rales, wheezing or rhonchi  ABDOMEN: Soft, non-tender, non-distended EXTREMITIES:  No edema; No deformity   Wt Readings from Last 3 Encounters:  06/25/24 80.8 kg  12/27/23 81.1 kg  06/20/23 78 kg     EKG today demonstrates  Atypical atrial flutter with variable block vs coarse afib Vent. rate 138 BPM PR interval * ms QRS duration 74 ms QT/QTcB 324/490 ms   Echo 06/20/24 demonstrated   1. Left ventricular ejection  fraction, by estimation, is 55 to 60%. The  left ventricle has normal function. The left ventricle has no regional  wall motion abnormalities. Left ventricular diastolic function could not  be evaluated.   2. Right ventricular systolic function is normal. The right ventricular  size is normal.   3. Left atrial size was mildly dilated.   4. The mitral valve is normal in structure. Mild mitral valve  regurgitation. No evidence of mitral stenosis.   5. The aortic valve is tricuspid. Aortic valve regurgitation is trivial.  No aortic stenosis is present.   6. The inferior vena cava is normal in size with greater than 50%  respiratory variability, suggesting right atrial pressure of 3 mmHg.    CHA2DS2-VASc Score = 3  The patient's score is based upon: CHF History: 0 HTN History: 1 Diabetes History: 0 Stroke History: 0 Vascular Disease History: 0 Age Score: 1 Gender Score: 1       ASSESSMENT AND PLAN: Persistent Atrial Fibrillation (ICD10:  I48.19) The patient's CHA2DS2-VASc score is 3, indicating a 3.2% annual risk of stroke.   Patient back in persistent afib with elevated rates. We discussed rhythm control options today. Will plan for DCCV. Check bmet/cbc today.  Increase Toprol  to 50 mg BID until DCCV then decrease back to once daily.  Continue Lopressor  25 mg PRN for heart racing. Continue Xarelto  20 mg daily  Secondary Hypercoagulable State (ICD10:  D68.69) The patient is at significant risk for stroke/thromboembolism based upon her CHA2DS2-VASc Score of 3.  Continue Rivaroxaban  (Xarelto ). No bleeding issues.   HTN Stable on current regimen    Follow up with Cassandra Hicks as scheduled and then in the AF clinic in 3 months.   Informed Consent   Shared Decision Making/Informed Consent The risks (stroke, cardiac arrhythmias rarely resulting in the need for a temporary or permanent pacemaker, skin irritation or burns and complications associated with conscious sedation  including aspiration, arrhythmia, respiratory failure and death), benefits (restoration of normal sinus rhythm) and alternatives of a direct current cardioversion were explained in detail to Ms. Dhingra and she agrees to proceed.         [facility] [address] [place], [place] [address] [phone]

## 2024-06-26 ENCOUNTER — Ambulatory Visit (HOSPITAL_COMMUNITY): Payer: Self-pay | Admitting: Physician Assistant

## 2024-06-26 LAB — BASIC METABOLIC PANEL WITH GFR
BUN/Creatinine Ratio: 18 (ref 12–28)
BUN: 19 mg/dL (ref 8–27)
CO2: 22 mmol/L (ref 20–29)
Calcium: 9.6 mg/dL (ref 8.7–10.3)
Chloride: 103 mmol/L (ref 96–106)
Creatinine, Ser: 1.07 mg/dL — ABNORMAL HIGH (ref 0.57–1.00)
Glucose: 95 mg/dL (ref 70–99)
Potassium: 4.2 mmol/L (ref 3.5–5.2)
Sodium: 140 mmol/L (ref 134–144)
eGFR: 57 mL/min/1.73 — ABNORMAL LOW (ref >59)

## 2024-06-26 LAB — CBC
Hematocrit: 40.9 % (ref 34.0–46.6)
Hemoglobin: 13.7 g/dL (ref 11.1–15.9)
MCH: 32.9 pg (ref 26.6–33.0)
MCHC: 33.5 g/dL (ref 31.5–35.7)
MCV: 98 fL — ABNORMAL HIGH (ref 79–97)
Platelets: 235 x10E3/uL (ref 150–450)
RBC: 4.16 x10E6/uL (ref 3.77–5.28)
RDW: 12.7 % (ref 11.7–15.4)
WBC: 7.6 x10E3/uL (ref 3.4–10.8)

## 2024-06-29 NOTE — Progress Notes (Signed)
 Pt called for pre procedure instructions. Arrival time 1200 NPO after midnight explained Instructed to take am meds with sip of water and confirmed blood thinner consistency, xarelto  Instructed pt need for ride home tomorrow and have responsible adult with them for 24 hrs post procedure.

## 2024-07-02 ENCOUNTER — Ambulatory Visit (HOSPITAL_COMMUNITY): Admitting: Anesthesiology

## 2024-07-02 ENCOUNTER — Encounter (HOSPITAL_COMMUNITY): Payer: Self-pay | Admitting: Internal Medicine

## 2024-07-02 ENCOUNTER — Encounter (HOSPITAL_COMMUNITY)
Admission: RE | Disposition: A | Discharge status: Home / Self Care | Payer: Self-pay | Source: Home / Self Care | Attending: Internal Medicine

## 2024-07-02 ENCOUNTER — Ambulatory Visit (HOSPITAL_COMMUNITY)
Admission: RE | Admit: 2024-07-02 | Discharge: 2024-07-02 | Disposition: A | Discharge status: Home / Self Care | Attending: Internal Medicine | Admitting: Internal Medicine

## 2024-07-02 ENCOUNTER — Other Ambulatory Visit: Payer: Self-pay

## 2024-07-02 DIAGNOSIS — I4891 Unspecified atrial fibrillation: Secondary | ICD-10-CM

## 2024-07-02 DIAGNOSIS — F419 Anxiety disorder, unspecified: Secondary | ICD-10-CM | POA: Diagnosis not present

## 2024-07-02 DIAGNOSIS — Z87891 Personal history of nicotine dependence: Secondary | ICD-10-CM | POA: Insufficient documentation

## 2024-07-02 DIAGNOSIS — I4819 Other persistent atrial fibrillation: Secondary | ICD-10-CM | POA: Insufficient documentation

## 2024-07-02 DIAGNOSIS — I10 Essential (primary) hypertension: Secondary | ICD-10-CM

## 2024-07-02 DIAGNOSIS — D6869 Other thrombophilia: Secondary | ICD-10-CM | POA: Diagnosis not present

## 2024-07-02 DIAGNOSIS — Z7901 Long term (current) use of anticoagulants: Secondary | ICD-10-CM | POA: Diagnosis not present

## 2024-07-02 DIAGNOSIS — Z79899 Other long term (current) drug therapy: Secondary | ICD-10-CM | POA: Insufficient documentation

## 2024-07-02 HISTORY — PX: CARDIOVERSION: EP1203

## 2024-07-02 SURGERY — CARDIOVERSION (CATH LAB)
Anesthesia: General

## 2024-07-02 MED ORDER — LIDOCAINE 2% (20 MG/ML) 5 ML SYRINGE
INTRAMUSCULAR | Status: DC | PRN
  Administered 2024-07-02: 60 mg via INTRAVENOUS

## 2024-07-02 MED ORDER — PROPOFOL 10 MG/ML IV BOLUS
INTRAVENOUS | Status: DC | PRN
  Administered 2024-07-02: 70 mg via INTRAVENOUS

## 2024-07-02 MED ORDER — SODIUM CHLORIDE 0.9 % IV SOLN: INTRAVENOUS | Status: DC

## 2024-07-02 SURGICAL SUPPLY — 1 items: PAD DEFIB RADIO PHYSIO CONN (PAD) ×2 IMPLANT

## 2024-07-02 NOTE — Interval H&P Note (Signed)
 History and Physical Interval Note:  07/02/2024 1:22 PM  Cassandra Hicks  has presented today for surgery, with the diagnosis of afib.  The various methods of treatment have been discussed with the patient and family. After consideration of risks, benefits and other options for treatment, the patient has consented to  Procedure(s): CARDIOVERSION (N/A) as a surgical intervention.  The patient's history has been reviewed, patient examined, no change in status, stable for surgery.  I have reviewed the patient's chart and labs.  Questions were answered to the patient's satisfaction.     Steve Gregg A Carmin Dibartolo

## 2024-07-02 NOTE — Transfer of Care (Signed)
 Immediate Anesthesia Transfer of Care Note  Patient: Cassandra Hicks  Procedure(s) Performed: CARDIOVERSION  Patient Location: PACU  Anesthesia Type:MAC  Level of Consciousness: patient cooperative  Airway & Oxygen Therapy: Patient Spontanous Breathing and Patient connected to nasal cannula oxygen  Post-op Assessment: Report given to RN, Post -op Vital signs reviewed and stable, Patient moving all extremities X 4, and Patient able to stick tongue midline  Post vital signs: Reviewed and stable  Last Vitals:  Vitals Value Taken Time  BP 117/85 07/02/24 13:15  Temp 98.1   Pulse 135 07/02/24 13:17  Resp 20 07/02/24 13:17  SpO2 95 % 07/02/24 13:17  Vitals shown include unfiled device data.  Last Pain:  Vitals:   07/02/24 1251  TempSrc:   PainSc: 0-No pain         Complications: No notable events documented.

## 2024-07-02 NOTE — CV Procedure (Signed)
 Procedure: Electrical Cardioversion Indications:  Atrial Fibrillation  Procedure Details:  Consent: Risks of procedure as well as the alternatives and risks of each were explained to the (patient/caregiver).  Consent for procedure obtained.  Time Out: Verified patient identification, verified procedure, site/side was marked, verified correct patient position, special equipment/implants available, medications/allergies/relevent history reviewed, required imaging and test results available. PERFORMED.  Patient placed on cardiac monitor, pulse oximetry, supplemental oxygen as necessary.  Sedation given: propofol  per anesthesia Pacer pads placed anterior and posterior chest.  Cardioverted 1 time(s).  Cardioversion with synchronized biphasic 200J shock.  Evaluation: Findings: Post procedure EKG shows: NSR Complications: None Patient did tolerate procedure well.  Time Spent Directly with the Patient:  15 minutes   Maximillian Habibi A Ngoc Daughtridge 07/02/2024, 1:34 PM

## 2024-07-02 NOTE — Anesthesia Preprocedure Evaluation (Addendum)
 Anesthesia Evaluation  Patient identified by MRN, date of birth, ID band Patient awake    Reviewed: Allergy & Precautions, NPO status , Patient's Chart, lab work & pertinent test results  History of Anesthesia Complications Negative for: history of anesthetic complications  Airway Mallampati: II  TM Distance: >3 FB Neck ROM: Full    Dental no notable dental hx. (+) Missing,    Pulmonary former smoker   Pulmonary exam normal breath sounds clear to auscultation       Cardiovascular hypertension, Pt. on medications and Pt. on home beta blockers (-) angina (-) Past MI Normal cardiovascular exam+ dysrhythmias Atrial Fibrillation + Valvular Problems/Murmurs (mild) MR  Rhythm:Regular Rate:Normal  06/20/2024 TTE  1. Left ventricular ejection fraction, by estimation, is 55 to 60%. The  left ventricle has normal function. The left ventricle has no regional  wall motion abnormalities. Left ventricular diastolic function could not  be evaluated.   2. Right ventricular systolic function is normal. The right ventricular  size is normal.   3. Left atrial size was mildly dilated.   4. The mitral valve is normal in structure. Mild mitral valve  regurgitation. No evidence of mitral stenosis.   5. The aortic valve is tricuspid. Aortic valve regurgitation is trivial.  No aortic stenosis is present.   6. The inferior vena cava is normal in size with greater than 50%  respiratory variability, suggesting right atrial pressure of 3 mmHg.      Neuro/Psych   Anxiety        GI/Hepatic Neg liver ROS,GERD  ,,  Endo/Other    Renal/GU negative Renal ROS  negative genitourinary   Musculoskeletal negative musculoskeletal ROS (+)    Abdominal   Peds  Hematology   Anesthesia Other Findings All PCN  Reproductive/Obstetrics                              Anesthesia Physical Anesthesia Plan  ASA: 3  Anesthesia Plan:  General   Post-op Pain Management: Minimal or no pain anticipated   Induction: Intravenous  PONV Risk Score and Plan: Treatment may vary due to age or medical condition  Airway Management Planned: Mask  Additional Equipment: None  Intra-op Plan:   Post-operative Plan:   Informed Consent: I have reviewed the patients History and Physical, chart, labs and discussed the procedure including the risks, benefits and alternatives for the proposed anesthesia with the patient or authorized representative who has indicated his/her understanding and acceptance.     Dental advisory given  Plan Discussed with: CRNA and Surgeon  Anesthesia Plan Comments:          Anesthesia Quick Evaluation

## 2024-07-02 NOTE — Anesthesia Postprocedure Evaluation (Signed)
 Anesthesia Post Note  Patient: Cassandra Hicks  Procedure(s) Performed: CARDIOVERSION     Patient location during evaluation: Cath Lab Anesthesia Type: General Level of consciousness: awake and alert Pain management: pain level controlled Vital Signs Assessment: post-procedure vital signs reviewed and stable Respiratory status: spontaneous breathing, nonlabored ventilation, respiratory function stable and patient connected to nasal cannula oxygen Cardiovascular status: blood pressure returned to baseline and stable Postop Assessment: no apparent nausea or vomiting Anesthetic complications: no   No notable events documented.  Last Vitals:  Vitals:   07/02/24 1420 07/02/24 1425  BP: 117/62 121/77  Pulse: 60 60  Resp: 18 (!) 23  Temp:    SpO2: 98% 100%    Last Pain:  Vitals:   07/02/24 1410  TempSrc:   PainSc: 0-No pain                 Garnette DELENA Gab

## 2024-07-02 NOTE — Anesthesia Procedure Notes (Signed)
 Procedure Name: MAC Date/Time: 07/02/2024 1:20 PM  Performed by: Viviana Almarie DASEN, CRNAPre-anesthesia Checklist: Patient identified, Suction available, Patient being monitored, Emergency Drugs available and Timeout performed Patient Re-evaluated:Patient Re-evaluated prior to induction Oxygen Delivery Method: Nasal cannula Induction Type: IV induction Placement Confirmation: positive ETCO2

## 2024-07-03 ENCOUNTER — Encounter: Payer: Self-pay | Admitting: Family Medicine

## 2024-07-05 ENCOUNTER — Other Ambulatory Visit: Payer: Self-pay | Admitting: Family Medicine

## 2024-07-05 DIAGNOSIS — J449 Chronic obstructive pulmonary disease, unspecified: Secondary | ICD-10-CM

## 2024-07-20 ENCOUNTER — Other Ambulatory Visit (HOSPITAL_COMMUNITY)

## 2024-07-24 NOTE — Progress Notes (Unsigned)
 Cardiology Office Note:    Date:  07/25/2024   ID:  Cassandra Hicks, DOB January 01, 1956, MRN [id number]  PCP:  Cassandra Darice CROME, MD   [facility] Providers Cardiologist:  Cassandra DELENA Merck, MD Cardiology APP:  Cassandra Hicks, Cassandra Hicks     Referring MD: Cassandra Darice CROME, MD   Chief Complaint  Patient presents with   Follow-up    Afib RVR    History of Present Illness:    Cassandra Hicks is a 68 y.o. female with a hx of PAF, hypertension, GERD, and former tobacco abuse.  She was diagnosed with atrial fibrillation with her PCP.  She reported fatigue and shortness of breath.  She was anticoagulated with Xarelto  and presented for TEE guided cardioversion 03/28/2023.  She was successfully converted to sinus rhythm.TEE notable for severe LAE. Echocardiogram 04/28/2023 showed an LVEF of 55-60%, normal RV function, mild MR.  Echocardiogram 06/2024 with preserved LVEF and mild MR, noted to be in atrial fibrillation.  She also reported symptoms of shortness of breath x 2 weeks.  She underwent repeat cardioversion 07/02/2024 with conversion to NSR.  She presents back for follow-up after cardioversion. She think she is back in Afib since last Thur/Friday due to lightheadedness and dizziness. No syncope.   EKG with VR 135. She feels poorly, 1+ LLE pitting edema, but also having bone spurs in that foot.     Past Medical History:  Diagnosis Date   Abnormal mammogram of left breast 09/17/2016   Anxiety    Breast mass, left    GERD (gastroesophageal reflux disease)    mild, no meds, diet controlled   Heart murmur    as child, no problem   Hypertension    Missed ab    x 2  D&E   SVD (spontaneous vaginal delivery)    x 2    Past Surgical History:  Procedure Laterality Date   ABDOMINAL HYSTERECTOMY N/A 12/09/2014   Procedure: HYSTERECTOMY ABDOMINAL;  Surgeon: Charlie CHRISTELLA Croak, MD;  Location: [facility];  Service: Gynecology;  Laterality: N/A;   APPENDECTOMY     BREAST BIOPSY Left 08/10/2016    BREAST EXCISIONAL BIOPSY Left 09/2016   BREAST LUMPECTOMY WITH RADIOACTIVE SEED LOCALIZATION Left 09/17/2016   Procedure: LEFT BREAST LUMPECTOMY WITH RADIOACTIVE SEED LOCALIZATION;  Surgeon: Elon Pacini, MD;  Location: [facility];  Service: General;  Laterality: Left;   CARDIOVERSION N/A 03/28/2023   Procedure: CARDIOVERSION;  Surgeon: Pietro Redell RAMAN, MD;  Location: [facility] INVASIVE CV LAB;  Service: Cardiovascular;  Laterality: N/A;   CARDIOVERSION N/A 07/02/2024   Procedure: CARDIOVERSION;  Surgeon: Hicks Cassandra DELENA, MD;  Location: [facility] INVASIVE CV LAB;  Service: Cardiovascular;  Laterality: N/A;   COLONOSCOPY     DIAGNOSTIC LAPAROSCOPY     DILATION AND CURETTAGE OF UTERUS     x 2 for MAB   ECTOPIC PREGNANCY SURGERY     x 2   EYE SURGERY     bilateral - Radial Keratotomy    SALPINGOOPHORECTOMY Bilateral 12/09/2014   Procedure: SALPINGO OOPHORECTOMY;  Surgeon: Charlie CHRISTELLA Croak, MD;  Location: [facility];  Service: Gynecology;  Laterality: Bilateral;   TEE WITHOUT CARDIOVERSION N/A 03/28/2023   Procedure: TRANSESOPHAGEAL ECHOCARDIOGRAM;  Surgeon: Pietro Redell RAMAN, MD;  Location: [facility] INVAS[facility] CV LAB;  Service: Cardiovascular;  Laterality: N/A;   TONSILLECTOMY     TUBAL LIGATION     WISDOM TOOTH EXTRACTION      Current Medications: Current Meds  Medication Sig  acetaminophen  (TYLENOL ) 500 MG tablet Take 1,000 mg by mouth every 6 (six) hours as needed for moderate pain.   cholecalciferol (VITAMIN D3) 25 MCG (1000 UNIT) tablet Take 1,000 Units by mouth daily.   loratadine (CLARITIN) 10 MG tablet Take 10 mg by mouth daily as needed for allergies.   losartan -hydrochlorothiazide  (HYZAAR) 50-12.5 MG tablet Take 1 tablet by mouth daily.   metoprolol  succinate (TOPROL -XL) 50 MG 24 hr tablet Take 50 mg by mouth daily. Take with or immediately following a meal.   metoprolol  tartrate (LOPRESSOR ) 25 MG tablet Take 25 mg by mouth daily as needed (palpitations).   Prenatal Vit-Fe  Fumarate-FA (PRENATAL PO) Take 1 tablet by mouth daily.   rivaroxaban  (XARELTO ) 20 MG TABS tablet Take 1 tablet (20 mg total) by mouth daily with supper.   [DISCONTINUED] metoprolol  succinate (TOPROL -XL) 50 MG 24 hr tablet Take 1 tablet (50 mg total) by mouth daily. Take with or immediately following a meal. (Patient taking differently: Take 50 mg by mouth 2 (two) times daily. Take with or immediately following a meal.)     Allergies:   Penicillins   Social History   Socioeconomic History   Marital status: Married    Spouse name: Not on file   Number of children: Not on file   Years of education: Not on file   Highest education level: Not on file  Occupational History   Not on file  Tobacco Use   Smoking status: Former    Current packs/day: 0.00    Average packs/day: 1 pack/day for 30.0 years (30.0 ttl pk-yrs)    Types: Cigarettes    Start date: 12/07/1966    Quit date: 12/07/1996    Years since quitting: 27.6   Smokeless tobacco: Never   Tobacco comments:    Former smoker 06/25/24  Substance and Sexual Activity   Alcohol use: Yes    Alcohol/week: 1.0 standard drink of alcohol    Types: 1 Cans of beer per week    Comment: social/weekends   Drug use: Yes    Types: Marijuana    Comment: every   Sexual activity: Yes    Birth control/protection: Post-menopausal  Other Topics Concern   Not on file  Social History Narrative   Not on file   Social Drivers of Health   Financial Resource Strain: Not on file  Food Insecurity: Not on file  Transportation Needs: Not on file  Physical Activity: Not on file  Stress: Not on file  Social Connections: Not on file     Family History: The patient's family history includes COPD in her mother; Cancer in her father; Hypertension in her brother, father, and sister.  ROS:   Please see the history of present illness.     All other systems reviewed and are negative.  EKGs/Labs/Other Studies Reviewed:    The following studies  were reviewed today:  EKG Interpretation Date/Time:  Wednesday July 25 2024 11:20:47 EDT Ventricular Rate:  135 PR Interval:    QRS Duration:  72 QT Interval:  284 QTC Calculation: 426 R Axis:   67  Text Interpretation: Atrial fibrillation with rapid ventricular response Low voltage QRS When compared with ECG of 02-Jul-2024 13:53, Atrial fibrillation has replaced Sinus rhythm Vent. rate has increased BY  75 BPM Confirmed by Cassandra Slough ([id number]) on 07/25/2024 11:22:44 AM    Recent Labs: 06/25/2024: BUN 19; Creatinine, Ser 1.07; Hemoglobin 13.7; Platelets 235; Potassium 4.2; Sodium 140  Recent Lipid Panel    Component  Value Date/Time   CHOL 199 06/05/2015 0900   TRIG 170 (H) 06/05/2015 0900   HDL 57 06/05/2015 0900   CHOLHDL 3.5 06/05/2015 0900   VLDL 34 (H) 06/05/2015 0900   LDLCALC 108 06/05/2015 0900     Risk Assessment/Calculations:    CHA2DS2-VASc Score = 3   This indicates a 3.2% annual risk of stroke. The patient's score is based upon: CHF History: 0 HTN History: 1 Diabetes History: 0 Stroke History: 0 Vascular Disease History: 0 Age Score: 1 Gender Score: 1        Physical Exam:    VS:  BP 122/84 (BP Location: Left Arm, Patient Position: Sitting)   Pulse (!) 56   Ht 5' 1 (1.549 m)   Wt 178 lb 12.8 oz (81.1 kg)   SpO2 98%   BMI 33.78 kg/m     Wt Readings from Last 3 Encounters:  07/25/24 178 lb 12.8 oz (81.1 kg)  07/02/24 175 lb (79.4 kg)  06/25/24 178 lb 3.2 oz (80.8 kg)     GEN:  Well nourished, well developed in no acute distress HEENT: Normal NECK: No JVD; No carotid bruits LYMPHATICS: No lymphadenopathy CARDIAC: irregular rhythm, tachycardic rate RESPIRATORY:  Clear to auscultation without rales, wheezing or rhonchi  ABDOMEN: Soft, non-tender, non-distended MUSCULOSKELETAL:  B LE edema L > R  SKIN: Warm and dry NEUROLOGIC:  Alert and oriented x 3 PSYCHIATRIC:  Normal affect   ASSESSMENT:    1. Persistent atrial fibrillation (HCC)    2. Chronic anticoagulation   3. Swelling of lower extremity   4. Primary hypertension    PLAN:    In order of problems listed above:  Persistent Afib TEE-DCCV 03/2023 to SR DCCV 07/02/2024 to SR Mild LAE on last echo On 50 mg toprol , which she took this morning EKG today with Afib with VR 135 - she feels poorly with intermittent dizziness and LE edema - discussed that persistent RVR can lead to worsening heart function and recommended ER evaluation - she agrees - HDS, will arrive by POV - suspect she will need antiarrhythmic +/- repeat cardioversion as bridge to possible ablation, although sever LAE   Chronic anticoagulation Continue xarelto  - has bee compliant   HTN Losartan -hydrochlorothiazide  50-12.5, Toprol  50 mg daily - has taken both this morning   Follow up with Dr. Acharya in 1 month.            Medication Adjustments/Labs and Tests Ordered: Current medicines are reviewed at length with the patient today.  Concerns regarding medicines are outlined above.  Orders Placed This Encounter  Procedures   EKG 12-Lead   No orders of the defined types were placed in this encounter.   Patient Instructions  YOUR PROVIDER RECOMMENDS THAT YOU GO TO [facility] EMERGENCY DEPARTMENT.   Signed, Jon Nat Hails, Cassandra Hicks  07/25/2024 11:49 AM    [facility]

## 2024-07-25 ENCOUNTER — Other Ambulatory Visit: Payer: Self-pay

## 2024-07-25 ENCOUNTER — Ambulatory Visit: Attending: Cardiology | Admitting: Physician Assistant

## 2024-07-25 ENCOUNTER — Other Ambulatory Visit (HOSPITAL_COMMUNITY): Payer: Self-pay

## 2024-07-25 ENCOUNTER — Emergency Department (HOSPITAL_COMMUNITY)

## 2024-07-25 ENCOUNTER — Emergency Department (HOSPITAL_COMMUNITY)
Admission: EM | Admit: 2024-07-25 | Discharge: 2024-07-25 | Disposition: A | Discharge status: Home / Self Care | Attending: Emergency Medicine | Admitting: Emergency Medicine

## 2024-07-25 ENCOUNTER — Encounter: Payer: Self-pay | Admitting: Physician Assistant

## 2024-07-25 VITALS — BP 122/84 | HR 56 | Ht 61.0 in | Wt 178.8 lb

## 2024-07-25 DIAGNOSIS — I10 Essential (primary) hypertension: Secondary | ICD-10-CM | POA: Insufficient documentation

## 2024-07-25 DIAGNOSIS — I4891 Unspecified atrial fibrillation: Secondary | ICD-10-CM | POA: Insufficient documentation

## 2024-07-25 DIAGNOSIS — E66811 Obesity, class 1: Secondary | ICD-10-CM

## 2024-07-25 DIAGNOSIS — M7989 Other specified soft tissue disorders: Secondary | ICD-10-CM

## 2024-07-25 DIAGNOSIS — Z6833 Body mass index (BMI) 33.0-33.9, adult: Secondary | ICD-10-CM

## 2024-07-25 DIAGNOSIS — D6869 Other thrombophilia: Secondary | ICD-10-CM

## 2024-07-25 DIAGNOSIS — R0602 Shortness of breath: Secondary | ICD-10-CM | POA: Diagnosis not present

## 2024-07-25 DIAGNOSIS — Z79899 Other long term (current) drug therapy: Secondary | ICD-10-CM | POA: Diagnosis not present

## 2024-07-25 DIAGNOSIS — E6609 Other obesity due to excess calories: Secondary | ICD-10-CM

## 2024-07-25 DIAGNOSIS — I4819 Other persistent atrial fibrillation: Secondary | ICD-10-CM

## 2024-07-25 DIAGNOSIS — Z7901 Long term (current) use of anticoagulants: Secondary | ICD-10-CM | POA: Diagnosis not present

## 2024-07-25 DIAGNOSIS — R0683 Snoring: Secondary | ICD-10-CM

## 2024-07-25 DIAGNOSIS — R42 Dizziness and giddiness: Secondary | ICD-10-CM | POA: Diagnosis present

## 2024-07-25 LAB — BASIC METABOLIC PANEL WITH GFR
Anion gap: 15 (ref 5–15)
BUN: 14 mg/dL (ref 8–23)
CO2: 22 mmol/L (ref 22–32)
Calcium: 9.5 mg/dL (ref 8.9–10.3)
Chloride: 101 mmol/L (ref 98–111)
Creatinine, Ser: 1.07 mg/dL — ABNORMAL HIGH (ref 0.44–1.00)
GFR, Estimated: 57 mL/min — ABNORMAL LOW (ref >60)
Glucose, Bld: 90 mg/dL (ref 70–99)
Potassium: 3.6 mmol/L (ref 3.5–5.1)
Sodium: 138 mmol/L (ref 135–145)

## 2024-07-25 LAB — CBC
HCT: 43.8 % (ref 36.0–46.0)
Hemoglobin: 14.6 g/dL (ref 12.0–15.0)
MCH: 32.4 pg (ref 26.0–34.0)
MCHC: 33.3 g/dL (ref 30.0–36.0)
MCV: 97.3 fL (ref 80.0–100.0)
Platelets: 267 K/uL (ref 150–400)
RBC: 4.5 MIL/uL (ref 3.87–5.11)
RDW: 12.5 % (ref 11.5–15.5)
WBC: 8.5 K/uL (ref 4.0–10.5)
nRBC: 0 % (ref 0.0–0.2)

## 2024-07-25 LAB — TROPONIN I (HIGH SENSITIVITY)
Troponin I (High Sensitivity): 3 ng/L (ref <18)
Troponin I (High Sensitivity): 3 ng/L (ref <18)

## 2024-07-25 LAB — RESP PANEL BY RT-PCR (RSV, FLU A&B, COVID)  RVPGX2
Influenza A by PCR: NEGATIVE
Influenza B by PCR: NEGATIVE
Resp Syncytial Virus by PCR: NEGATIVE
SARS Coronavirus 2 by RT PCR: NEGATIVE

## 2024-07-25 LAB — BRAIN NATRIURETIC PEPTIDE: B Natriuretic Peptide: 250.2 pg/mL — ABNORMAL HIGH (ref 0.0–100.0)

## 2024-07-25 LAB — MAGNESIUM: Magnesium: 2 mg/dL (ref 1.7–2.4)

## 2024-07-25 MED ORDER — DILTIAZEM HCL-DEXTROSE 125-5 MG/125ML-% IV SOLN (PREMIX)
5.0000 mg/h | INTRAVENOUS | Status: DC
  Administered 2024-07-25: 5 mg/h via INTRAVENOUS
  Filled 2024-07-25: qty 125

## 2024-07-25 MED ORDER — LOSARTAN POTASSIUM-HCTZ 50-12.5 MG PO TABS: 0.5000 | ORAL_TABLET | ORAL | Status: DC

## 2024-07-25 MED ORDER — DILTIAZEM LOAD VIA INFUSION
10.0000 mg | Freq: Once | INTRAVENOUS | Status: AC
  Administered 2024-07-25: 10 mg via INTRAVENOUS
  Filled 2024-07-25: qty 10

## 2024-07-25 MED ORDER — AMIODARONE HCL 200 MG PO TABS
ORAL_TABLET | ORAL | 0 refills | Status: DC
  Filled 2024-07-25: qty 90, 74d supply, fill #0

## 2024-07-25 MED ORDER — AMIODARONE HCL 200 MG PO TABS
400.0000 mg | ORAL_TABLET | Freq: Once | ORAL | Status: AC
  Administered 2024-07-25: 400 mg via ORAL
  Filled 2024-07-25: qty 2

## 2024-07-25 MED ORDER — METOPROLOL SUCCINATE ER 100 MG PO TB24
100.0000 mg | ORAL_TABLET | Freq: Every day | ORAL | 0 refills | Status: DC
  Filled 2024-07-25: qty 90, 90d supply, fill #0

## 2024-07-25 NOTE — H&P (Addendum)
 CARDIOLOGY ADMIT NOTE     Patient ID: Cassandra Hicks MRN: [id number] DOB/AGE: 67-Jul-1957 67 y.o.  Admit date: 07/25/2024.   Primary Physician:  Burney Cassandra CROME, MD  Patient ID: Cassandra Hicks, female    DOB: August 11, 1956, 68 y.o.   MRN: [id number]  Chief Complaint  Patient presents with   AFib RVR   HPI:    Cassandra Hicks  is a 68 y.o. Caucasian female patient with paroxysmal atrial fibrillation, primary hypertension, presenting with shortness of breath and fatigue, had undergone direct-current cardioversion on 07/02/2024 to sinus rhythm.  She had a routine follow-up visit for post cardioversion follow-up and stated that since past 4 to 5 days over the weekend she started noticing fatigue and shortness of breath similar to her A-fib presentation and felt that she was back in atrial fibrillation.  She was seen in our office and was found to be in A-fib with RVR, was sent over to the emergency room for rate control and possible cardioversion in the ED.  Patient is presently doing well, remains asymptomatic at rest, denies any chest pain, shortness of breath, PND or orthopnea or leg edema.  She has been compliant with all her medication but missed the dose as she was in a hurry and was traveling 3 days ago taking Xarelto .  Past Medical History:  Diagnosis Date   Abnormal mammogram of left breast 09/17/2016   Anxiety    Breast mass, left    GERD (gastroesophageal reflux disease)    mild, no meds, diet controlled   Heart murmur    as child, no problem   Hypertension    Missed ab    x 2  D&E   SVD (spontaneous vaginal delivery)    x 2   Past Surgical History:  Procedure Laterality Date   ABDOMINAL HYSTERECTOMY N/A 12/09/2014   Procedure: HYSTERECTOMY ABDOMINAL;  Surgeon: Charlie CHRISTELLA Croak, MD;  Location: [facility];  Service: Gynecology;  Laterality: N/A;   APPENDECTOMY     BREAST BIOPSY Left 08/10/2016   BREAST EXCISIONAL BIOPSY Left 09/2016   BREAST LUMPECTOMY WITH RADIOACTIVE SEED  LOCALIZATION Left 09/17/2016   Procedure: LEFT BREAST LUMPECTOMY WITH RADIOACTIVE SEED LOCALIZATION;  Surgeon: Elon Pacini, MD;  Location: [facility];  Service: General;  Laterality: Left;   CARDIOVERSION N/A 03/28/2023   Procedure: CARDIOVERSION;  Surgeon: Pietro Redell RAMAN, MD;  Location: [facility];  Service: Cardiovascular;  Laterality: N/A;   CARDIOVERSION N/A 07/02/2024   Procedure: CARDIOVERSION;  Surgeon: Loni Soyla LABOR, MD;  Location: [facility];  Service: Cardiovascular;  Laterality: N/A;   COLONOSCOPY     DIAGNOSTIC LAPAROSCOPY     DILATION AND CURETTAGE OF UTERUS     x 2 for MAB   ECTOPIC PREGNANCY SURGERY     x 2   EYE SURGERY     bilateral - Radial Keratotomy    SALPINGOOPHORECTOMY Bilateral 12/09/2014   Procedure: SALPINGO OOPHORECTOMY;  Surgeon: Charlie CHRISTELLA Croak, MD;  Location: [facility];  Service: Gynecology;  Laterality: Bilateral;   TEE WITHOUT CARDIOVERSION N/A 03/28/2023   Procedure: TRANSESOPHAGEAL ECHOCARDIOGRAM;  Surgeon: Pietro Redell RAMAN, MD;  Location: [facility];  Service: Cardiovascular;  Laterality: N/A;   TONSILLECTOMY     TUBAL LIGATION     WISDOM TOOTH EXTRACTION     Social History   Tobacco Use   Smoking status: Former    Current packs/day: 0.00    Average packs/day: 1 pack/day for 30.0  years (30.0 ttl pk-yrs)    Types: Cigarettes    Start date: 12/07/1966    Quit date: 12/07/1996    Years since quitting: 27.6   Smokeless tobacco: Never   Tobacco comments:    Former smoker 06/25/24  Substance Use Topics   Alcohol use: Yes    Alcohol/week: 1.0 standard drink of alcohol    Types: 1 Cans of beer per week    Comment: social/weekends   Family History  Problem Relation Age of Onset   COPD Mother    Cancer Father        lung   Hypertension Father    Hypertension Sister    Hypertension Brother     ROS  Review of Systems  Constitutional: Positive for malaise/fatigue.  Cardiovascular:  Positive for  dyspnea on exertion. Negative for chest pain and leg swelling.  Respiratory:  Positive for snoring.    Objective      07/25/2024    1:54 PM 07/25/2024    1:51 PM 07/25/2024    1:48 PM  Vitals with BMI  Systolic 117 97 109  Diastolic 70 58 83  Pulse 97 78 88      Physical Exam Neck:     Vascular: No carotid bruit or JVD.  Cardiovascular:     Rate and Rhythm: Normal rate. Rhythm irregular.     Pulses: Normal pulses and intact distal pulses.     Heart sounds: No murmur heard. Pulmonary:     Effort: Pulmonary effort is normal.     Breath sounds: Normal breath sounds.  Abdominal:     General: Bowel sounds are normal.     Palpations: Abdomen is soft.  Musculoskeletal:     Right lower leg: No edema.     Left lower leg: No edema.  Skin:    Capillary Refill: Capillary refill takes less than 2 seconds.    Laboratory examination:    Recent Labs    06/25/24 1120 07/25/24 1243  NA 140 138  K 4.2 3.6  CL 103 101  CO2 22 22  GLUCOSE 95 90  BUN 19 14  CREATININE 1.07* 1.07*  CALCIUM 9.6 9.5  GFRNONAA  --  57*   estimated creatinine clearance is 49.9 mL/min (A) (by C-G formula based on SCr of 1.07 mg/dL (H)).     Latest Ref Rng & Units 07/25/2024   12:43 PM 06/25/2024   11:20 AM 03/28/2023   11:32 AM  CMP  Glucose 70 - 99 mg/dL 90  95  90   BUN 8 - 23 mg/dL 14  19  14    Creatinine 0.44 - 1.00 mg/dL 8.92  8.92  8.79   Sodium 135 - 145 mmol/L 138  140  143   Potassium 3.5 - 5.1 mmol/L 3.6  4.2  3.9   Chloride 98 - 111 mmol/L 101  103  106   CO2 22 - 32 mmol/L 22  22    Calcium 8.9 - 10.3 mg/dL 9.5  9.6        Latest Ref Rng & Units 07/25/2024   12:43 PM 06/25/2024   11:20 AM 03/28/2023   11:32 AM  CBC  WBC 4.0 - 10.5 K/uL 8.5  7.6    Hemoglobin 12.0 - 15.0 g/dL 85.3  86.2  85.3   Hematocrit 36.0 - 46.0 % 43.8  40.9  43.0   Platelets 150 - 400 K/uL 267  235     Lipid Panel     Component Value Date/Time  CHOL 199 06/05/2015 0900   TRIG 170 (H) 06/05/2015 0900    HDL 57 06/05/2015 0900   CHOLHDL 3.5 06/05/2015 0900   VLDL 34 (H) 06/05/2015 0900   LDLCALC 108 06/05/2015 0900   HEMOGLOBIN A1C Lab Results  Component Value Date   HGBA1C 5.5 06/05/2015   MPG 111 06/05/2015   TSH No results for input(s): TSH in the last 8760 hours. BNP (last 3 results) No results for input(s): BNP in the last 8760 hours. Cardiac Panel (last 3 results) Recent Labs    07/25/24 1243  TROPONINIHS 3     Medications and allergies   Allergies  Allergen Reactions   Penicillins Itching     Current Outpatient Medications  Medication Instructions   acetaminophen  (TYLENOL ) 1,000 mg, Every 6 hours PRN   amiodarone  (PACERONE ) 200 MG tablet Take 1 tablet (200 mg total) by mouth in the morning and at bedtime for 14 days, THEN 1 tablet (200 mg total) daily.   cholecalciferol (VITAMIN D3) 1,000 Units, Daily   loratadine (CLARITIN) 10 mg, Daily PRN   losartan -hydrochlorothiazide  (HYZAAR) 50-12.5 MG tablet 0.5 tablets, Oral, BH-each morning   metoprolol  succinate (TOPROL -XL) 100 mg, Oral, Daily, Take with or immediately following a meal.   metoprolol  tartrate (LOPRESSOR ) 25 mg, Daily PRN   Prenatal Vit-Fe Fumarate-FA (PRENATAL PO) 1 tablet, Daily   rivaroxaban  (XARELTO ) 20 mg, Oral, Daily with supper    No intake/output data recorded. Total I/O In: 12.3 [I.V.:12.3] Out: -     Radiology:  DG Chest Portable 1 View Result Date: 07/25/2024 CLINICAL DATA:  Shortness of breath. EXAM: PORTABLE CHEST 1 VIEW COMPARISON:  September 20, 2016. FINDINGS: The heart size and mediastinal contours are within normal limits. Both lungs are clear. The visualized skeletal structures are unremarkable. IMPRESSION: No active disease. Electronically Signed   By: Lynwood Landy Raddle M.D.   On: 07/25/2024 13:43    Cardiac Studies:   ECHOCARDIOGRAM COMPLETE 06/20/2024  1. Left ventricular ejection fraction, by estimation, is 55 to 60%. The left ventricle has normal function. The left  ventricle has no regional wall motion abnormalities. Left ventricular diastolic function could not be evaluated. 2. Right ventricular systolic function is normal. The right ventricular size is normal. 3. Left atrial size was mildly dilated. 4.  Mild mitral valve regurgitation.  EKG 07/25/2024: Atrial fibrillation with rapid ventricular sponsor rate of 155 bpm, normal axis, nonspecific T abnormality.  Compared to 07/02/2024, normal sinus rhythm.  Assessment   Paroxysmal atrial fibrillation with RVR symptomatic with fatigue and dyspnea Loud snoring Primary hypertension Mild obesity due to excess calories  Recommendations:   Patient with normal LV systolic function, EKG revealing no evidence of ischemia, patient asymptomatic and went for a routine visit where she was found to be back in A-fib with RVR, recently had undergone direct-current cardioversion on 07/02/2024.  I highly doubt she will maintain sinus rhythm without antiarrhythmic therapy.  Patient is presently only 68 years of age with a CHA2DS2-VASc score of 3, has missed 1 dose of rivaroxaban  3 days ago.  As patient has normal LVEF, no clinical evidence of heart failure, rate control and continue anticoagulation and cardioversion in 2 weeks would be appropriate.  In view of her age, do not want to use long-term antiarrhythmic therapy.  Will start her on amiodarone  200 mg twice daily for 2 weeks followed by 200 mg once a day, will increase her metoprolol  succinate from 50 to 100 mg daily and reduce the dose of the losartan   HCT from 50/12.5 mg to 1/2 tablet daily.  If she experiences any dizziness or low blood pressure, she can certainly discontinue losartan .  Also with administration of IV diltiazem , she dropped her blood pressure.  She has no heart rate much improved around 90 to 110 bpm, remains asymptomatic without heart failure or anginal symptoms, hence I feel she is stable for discharge.  Chances of her cardioverting to sinus rhythm  on amiodarone  is very low but hopefully it will help to maintain sinus rhythm once cardioversion is performed in 2 weeks.  Even if she were to convert to sinus rhythm, she has missed only 1 dose of Xarelto , half-life being long, I do not think she would have have a high risk of cardioembolic phenomena.  I have extensively discussed with the patient regarding risks and benefits of cardioversion, patient is willing to proceed and states that she has had this before.  This will be scheduled in 2 weeks approximately.  For now she is aware that to take Xarelto  in the evening and not in the morning and not to miss any doses.  Patient has been recommended to have sleep study, I urged her that she should probably proceed with setting up a date for sleep study.  Weight loss also discussed extensively.    Gordy Bergamo, MD, [facility] 07/25/2024, 2:20 PM [facility] [address] [place], [place] [address] Phone: [phone]. Fax:  [phone]

## 2024-07-25 NOTE — ED Provider Notes (Signed)
 [place] EMERGENCY DEPARTMENT AT [facility] Provider Note   CSN: [id number] Arrival date & time: 07/25/24  1204     Patient presents with: AFib RVR   Cassandra Hicks is a 68 y.o. female.   HPI     68 year old female with a history of paroxysmal atrial fibrillation on Xarelto , hypertension, GERD, who presents with concern for atrial fibrillation from cardiology clinic.   Has had prior cardioversions with most recent August 25 and presented today to cardiology for her follow-up after this cardioversion.  She suspect she has been in atrial fibrillation since Thursday or Friday as she has been feeling lightheaded.  She has had lightheadedness, sensation of chest fullness, palpitations, mild dyspnea. No syncope.    She went to the cardiology office for her follow up and was found to be in atrial fibrillation with a heart rate of 135.  She just drove back from [place] on Sunday.  She has not noticed any leg pain or swelling, but notes that the cardiology office provider noticed some swelling.  Denies any nausea, vomiting, fever.  She has a very mild cough. No congestion or sore throat.  Reports she feels similar to how she has felt in the past with atrial fibrillation although the symptoms are not as severe, reports her shortness of breath is not as bad as it has been in the past when she has had A-fib  Past Medical History:  Diagnosis Date   Abnormal mammogram of left breast 09/17/2016   Anxiety    Breast mass, left    GERD (gastroesophageal reflux disease)    mild, no meds, diet controlled   Heart murmur    as child, no problem   Hypertension    Missed ab    x 2  D&E   SVD (spontaneous vaginal delivery)    x 2     Prior to Admission medications   Medication Sig Start Date End Date Taking? Authorizing Provider  acetaminophen  (TYLENOL ) 500 MG tablet Take 1,000 mg by mouth every 6 (six) hours as needed for moderate pain.   Yes [provider]  amiodarone  (PACERONE )  200 MG tablet Take 1 tablet (200 mg total) by mouth in the morning and at bedtime for 14 days, THEN 1 tablet (200 mg total) daily. 07/25/24 10/07/24 Yes Ladona Heinz, MD  cholecalciferol (VITAMIN D3) 25 MCG (1000 UNIT) tablet Take 1,000 Units by mouth daily.   Yes [provider]  loratadine (CLARITIN) 10 MG tablet Take 10 mg by mouth daily as needed for allergies.   Yes [provider]  losartan -hydrochlorothiazide  (HYZAAR) 50-12.5 MG tablet Take 1 tablet by mouth daily.   Yes [provider]  metoprolol  succinate (TOPROL -XL) 50 MG 24 hr tablet Take 50 mg by mouth daily. Take with or immediately following a meal.   Yes [provider]  Prenatal Vit-Fe Fumarate-FA (PRENATAL PO) Take 1 tablet by mouth daily.   Yes [provider]  rivaroxaban  (XARELTO ) 20 MG TABS tablet Take 1 tablet (20 mg total) by mouth daily with supper. 06/20/24  Yes Duke, Jon Garre, PA  losartan -hydrochlorothiazide  (HYZAAR) 50-12.5 MG tablet Take 0.5 tablets by mouth every morning. 07/25/24   Ladona Heinz, MD  metoprolol  succinate (TOPROL -XL) 100 MG 24 hr tablet Take 1 tablet (100 mg total) by mouth daily. Take with or immediately following a meal. 07/25/24   Ladona Heinz, MD    Allergies: Penicillins    Review of Systems  Updated Vital Signs BP 117/70  Pulse 97   Temp 98 F (36.7 C)   Resp 19   SpO2 100%   Physical Exam Vitals and nursing note reviewed.  Constitutional:      General: She is not in acute distress.    Appearance: She is well-developed. She is not diaphoretic.  HENT:     Head: Normocephalic and atraumatic.  Eyes:     Conjunctiva/sclera: Conjunctivae normal.  Cardiovascular:     Rate and Rhythm: Tachycardia present. Rhythm irregular.     Pulses: Normal pulses.     Heart sounds: Normal heart sounds. No murmur heard.    No friction rub. No gallop.  Pulmonary:     Effort: Pulmonary effort is normal. No respiratory distress.     Breath sounds: Normal breath  sounds. No wheezing or rales.  Abdominal:     General: There is no distension.     Palpations: Abdomen is soft.     Tenderness: There is no abdominal tenderness. There is no guarding.  Musculoskeletal:        General: No tenderness.     Cervical back: Normal range of motion.  Skin:    General: Skin is warm and dry.     Findings: No erythema or rash.  Neurological:     Mental Status: She is alert and oriented to person, place, and time.     (all labs ordered are listed, but only abnormal results are displayed) Labs Reviewed  BASIC METABOLIC PANEL WITH GFR - Abnormal; Notable for the following components:      Result Value   Creatinine, Ser 1.07 (*)    GFR, Estimated 57 (*)    All other components within normal limits  BRAIN NATRIURETIC PEPTIDE - Abnormal; Notable for the following components:   B Natriuretic Peptide 250.2 (*)    All other components within normal limits  RESP PANEL BY RT-PCR (RSV, FLU A&B, COVID)  RVPGX2  MAGNESIUM  CBC  TROPONIN I (HIGH SENSITIVITY)  TROPONIN I (HIGH SENSITIVITY)    EKG: None  Radiology: DG Chest Portable 1 View Result Date: 07/25/2024 CLINICAL DATA:  Shortness of breath. EXAM: PORTABLE CHEST 1 VIEW COMPARISON:  September 20, 2016. FINDINGS: The heart size and mediastinal contours are within normal limits. Both lungs are clear. The visualized skeletal structures are unremarkable. IMPRESSION: No active disease. Electronically Signed   By: Lynwood Landy Raddle M.D.   On: 07/25/2024 13:43     Procedures   Medications Ordered in the ED  diltiazem  (CARDIZEM ) 1 mg/mL load via infusion 10 mg (10 mg Intravenous Bolus from Bag 07/25/24 1311)  amiodarone  (PACERONE ) tablet 400 mg (400 mg Oral Given 07/25/24 1418)                                     68 year old female with a history of paroxysmal atrial fibrillation on Xarelto , hypertension, GERD, who presents with concern for atrial fibrillation from cardiology clinic.  EKG completed and evaluated  by me shows atrial fibrillation with RVR.  Chest x-ray was completed and evaluated by radiology and me showed no evidence of pneumonia, pneumothorax, or pulmonary edema.  Labs completed personally by and interpreted by me show no leukocytosis, no anemia.  No clinically significant electrolyte abnormalities, creatinine at baseline.  Troponin within normal limits.  BNP mildly elevated.  Clinically, have low suspicion for acute CHF exacerbation at this time that require admission to the hospital.  Low clinical  suspicion for a PE in the setting of patient being on anticoagulation, symptoms similar to prior A-fib with RVR.  Suspect her symptoms are secondary to being in atrial fibrillation with RVR.  Heart rate was initially 140s on arrival to the emergency department.  Discussed given his dose of Xarelto , I do not recommend emergency department cardioversion at this time and initiated diltiazem  drip and bolus.  After receiving diltiazem , her heart rate has decreased to the 90s and she is feeling improved.  Cardiology was consulted, Dr. Ladona came to bedside for evaluation of the patient.  At this time we feel she is stable for discharge with outpatient follow-up. Per Dr. Bary start her on amiodarone  200 mg twice daily for 2 weeks followed by 200 mg once a day, will increase her metoprolol  succinate from 50 to 100 mg daily and reduce the dose of the losartan  HCT from 50/12.5 mg to 1/2 tablet daily. If she experiences any dizziness or low blood pressure, she can certainly discontinue losartan .       Final diagnoses:  Atrial fibrillation with RVR [facility])    ED Discharge Orders          Ordered    Amb referral to AFIB Clinic        07/25/24 1222    losartan -hydrochlorothiazide  (HYZAAR) 50-12.5 MG tablet  BH-each morning        07/25/24 1419    metoprolol  succinate (TOPROL -XL) 100 MG 24 hr tablet  Daily        07/25/24 1419    amiodarone  (PACERONE ) 200 MG tablet  Multiple Frequencies         07/25/24 1419               Dreama Longs, MD 07/25/24 1448

## 2024-07-25 NOTE — ED Triage Notes (Signed)
 Pt sent from [facility] heart clinic for Afib with a HR 135. Pt has hx of AFIB. Pt denies chest pain.

## 2024-07-25 NOTE — ED Notes (Signed)
 Cardizem  drip paused, MD notified of BP reading 88/59.

## 2024-07-25 NOTE — Discharge Instructions (Addendum)
 IF your blood pressure is low, and you are feeling dizzy, you may discontinue the losartan  and call Cardiology.

## 2024-07-25 NOTE — Patient Instructions (Signed)
 YOUR PROVIDER RECOMMENDS THAT YOU GO TO [place] EMERGENCY DEPARTMENT.

## 2024-07-25 NOTE — ED Notes (Addendum)
 Pt complains of heaviness on her chest, shob, blurred vision, occasional lightheadedness, and slight headache 1/10 (fullness feeling). Husband at bedside with pt. CCMD called for pt monitoring.

## 2024-07-25 NOTE — ED Triage Notes (Signed)
 Patient with hx of afib here after feeling herself go into afib last week. States symptoms intermittent last week but this week have been more constant. On Xarelto  and no missed doses except this past Sunday but last took this morning. HR 165 in triage. Endorses generalized weakness, SOB, and palpitations.

## 2024-07-25 NOTE — ED Provider Triage Note (Signed)
 Emergency Medicine Provider Triage Evaluation Note  AMY GOTHARD , a 68 y.o. female  was evaluated in triage.  Pt complains of SHOB, slight fatigue, chest fullness. Hx of afib, cardioversion in August. Feels like has been in a fib x 1 week, intermittent for the week before that. On Xarelto . Missed a dose Sunday.  Review of Systems  Positive:  Negative:   Physical Exam  BP (!) 137/106 (BP Location: Right Arm)   Pulse (!) 103   Temp 98 F (36.7 C)   Resp 16   SpO2 100%  Gen:   Awake, no distress   Resp:  Normal effort  MSK:   Moves extremities without difficulty  Other:  HR rapid and irregular   Medical Decision Making  Medically screening exam initiated at 12:20 PM.  Appropriate orders placed.  SUNDUS PETE was informed that the remainder of the evaluation will be completed by another provider, this initial triage assessment does not replace that evaluation, and the importance of remaining in the ED until their evaluation is complete.  Charge nurse aware, working on a room.    Beverley Leita LABOR, PA-C 07/25/24 1221

## 2024-07-25 NOTE — ED Notes (Signed)
 Cardiologist at bedside, verbal orders given to stop Cardizem  drip. Drip stopped.

## 2024-07-27 ENCOUNTER — Telehealth: Payer: Self-pay | Admitting: Physician Assistant

## 2024-07-27 MED ORDER — METOPROLOL SUCCINATE ER 100 MG PO TB24: 100.0000 mg | ORAL_TABLET | Freq: Every day | ORAL | 3 refills | Status: DC

## 2024-07-27 NOTE — Telephone Encounter (Signed)
 Pt would like a c/b in regards to her hospital visit.

## 2024-07-27 NOTE — Telephone Encounter (Signed)
 Spoke to patient she stated she saw Cassandra Hails PA this week 9/17.Stated Cassandra Hicks sent her to ED due to fast heart beat.Stated Metoprolol  Succ increased to 100 mg daily.She will use up 50 mg tablets and take 2 every day until finishes then take 100 mg tablet daily.She was started on Amiodarone .She has been taking 200 mg twice a day since 9/18.She continues to have palpitations,sob.Stated not as bad as before.Her head feels full.Stated she thought she would be feeling better by now.She has plans to go out of town next Friday.Advised not enough time to notice a difference yet.Continue to take medications as prescribed.Advised to keep appointment scheduled with Cassandra Arthur PA 10/10 at 1:30 pm.I will send message to Cassandra Hicks for review.

## 2024-07-31 ENCOUNTER — Telehealth: Payer: Self-pay | Admitting: Internal Medicine

## 2024-07-31 NOTE — Telephone Encounter (Signed)
 Pt c/o medication issue:  1. Name of Medication: amiodarone  (PACERONE ) 200 MG tablet   2. How are you currently taking this medication (dosage and times per day)?   tablet Take 1 tablet (200 mg total) by mouth in the morning and at bedtime for 14 days, THEN 1 tablet (200 mg total) daily.    3. Are you having a reaction (difficulty breathing--STAT)? No  4. What is your medication issue? Pt has still been experiencing afib since her visits last week. Pt states she was advised to give the medicine some time. Pt requesting callback to clarify dosage. Please advise.

## 2024-07-31 NOTE — Telephone Encounter (Signed)
 Spoke with pt who is reporting she is still having At Fib with rates between 60 to 138 bpm per her pulse ox.  BO is good at 116/82.  She is taking Amiodarone  200 mg BID and has been on this dose for 1 week.  She is also taking Metoprolol  Succinate 100 mg daily and Xarelto  20 mg daily.  Advised to continue to take medications as ordered.   Continue to monitor heart rate and BP.  She is to notify the office if HR continue to be above 100 bpm.  She denies having any symptoms other than feeling like her heart is jumping and having an occasional jolt feeling.  She is going out of town soon (a cruise).  Advised to limit alcohol, nicotine, caffeine and other stimulants.  She was appreciative of the call and information.  She will f/u as scheduled when she returns on 08/17/24.  Will forward this to Jon Hails, [place] and Dr Loni for their knowledge.

## 2024-08-02 ENCOUNTER — Other Ambulatory Visit: Payer: Self-pay | Admitting: Family Medicine

## 2024-08-02 DIAGNOSIS — J449 Chronic obstructive pulmonary disease, unspecified: Secondary | ICD-10-CM

## 2024-08-16 ENCOUNTER — Ambulatory Visit: Admitting: Physician Assistant

## 2024-08-16 NOTE — H&P (View-Only) (Signed)
 Cardiology Office Note:  .   Date:  08/16/2024  ID:  Cassandra Hicks, DOB 1955-11-16, MRN [id number] PCP: Burney Darice CROME, MD  [facility] Providers Cardiologist:  Soyla DELENA Merck, MD Cardiology APP:  Madie Jon Garre, PA {  History of Present Illness: .   Cassandra Hicks is a 67 y.o. female w/PMHx of  HTN, GERD AFib  Initially found with AFib May 2024 Started on OAC, planned for TEE/DCCV   DCCV 03/28/23  Did well until this fall, saw AFib clinic Aug 2025 with ~ 2 weeks of some SOB > was back in AFib w/RVR and planned for DCCV, BB was increased   DCCV 07/02/24  Saw cards in f/u 07/25/24, again in AFib w/RVR, reported feeling dizzy, lightheaded and referred to the ER In the ER, MD elicited report of a missed dose of her Xarelto , and did not pursue DCCV She was seen by Dr. Ladona, found to be HD stable, and minimally symptomatic > recommended amiodarone  be started 200 mg twice daily for 2 weeks followed by 200 mg once a day, with no expectation of CV via drug, though should give some additional rate control > to see EP out patient to consider TEE/DCCV on AAD > perhaps ablation as long term strategy    Today's visit is scheduled to discussed management strategies ROS:   She is accompanied by her husband In AFib she feels poorly Tired, no energy, winded easily  When not in AFib feels well She is active, typically with good exertional capacity She worked a gold tournament out in the heat some days upwards of 12 hours or so, prior to going back into AFib this time, wonders if it may have triggered it.  No CP Near syncope or syncope No bleeding  She is certain of no missed doses of Xarelto  since her ER visit (9/17)   Arrhythmia/AAD hx Amiodarone  started 07/25/24  Studies Reviewed: SABRA    EKG done today and reviewed by myself:  AFib 100bpm  Echo 06/20/24   1. Left ventricular ejection fraction, by estimation, is 55 to 60%. The  left ventricle has normal function. The  left ventricle has no regional  wall motion abnormalities. Left ventricular diastolic function could not  be evaluated.   2. Right ventricular systolic function is normal. The right ventricular  size is normal.   3. Left atrial size was mildly dilated.   4. The mitral valve is normal in structure. Mild mitral valve  regurgitation. No evidence of mitral stenosis.   5. The aortic valve is tricuspid. Aortic valve regurgitation is trivial.  No aortic stenosis is present.   6. The inferior vena cava is normal in size with greater than 50%  respiratory variability, suggesting right atrial pressure of 3 mmHg.   Risk Assessment/Calculations:    Physical Exam:   VS:  There were no vitals taken for this visit.   Wt Readings from Last 3 Encounters:  07/25/24 178 lb 12.8 oz (81.1 kg)  07/02/24 175 lb (79.4 kg)  06/25/24 178 lb 3.2 oz (80.8 kg)    GEN: Well nourished, well developed in no acute distress NECK: No JVD; No carotid bruits CARDIAC: irreg-irreg, no murmurs, rubs, gallops RESPIRATORY:  CTA b/l without rales, wheezing or rhonchi  ABDOMEN: Soft, non-tender, non-distended EXTREMITIES: No edema; No deformity   ASSESSMENT AND PLAN: .    persistent AFib CHA2DS2Vasc is 3, on xarelto , appropriately dosed Amiodarone  started 07/25/24 Rate is much improved She is symptomatic with  AFib  Long discussion today about what AFib is Risk of stroke, her risk score and importance of her Xarelto /role of anticoagulation. Discussed role of amiodarone , and ablation. I think she would be a good ablation candidate She is 3 weeks uninterrupted a/c Will plan DCCV for next week I have messaged the EP scheduler to hold her a spot for AFib ablation (pt knows this will realistically be Jan)  Secondary hypercoagulable state 2/2 AFib     Informed Consent   Shared Decision Making/Informed Consent The risks (stroke, cardiac arrhythmias rarely resulting in the need for a temporary or permanent pacemaker,  skin irritation or burns and complications associated with conscious sedation including aspiration, arrhythmia, respiratory failure and death), benefits (restoration of normal sinus rhythm) and alternatives of a direct current cardioversion were explained in detail to Ms. Surace and she agrees to proceed.     Dispo: back in ~ 66mo or so to see Dr. Inocencio (MD only) to discuss ablation further   Signed, Charlies Macario Arthur, PA-C

## 2024-08-16 NOTE — Progress Notes (Unsigned)
 Cardiology Office Note:  .   Date:  08/16/2024  ID:  Cassandra Hicks, DOB 1955-11-16, MRN [id number] PCP: Burney Darice CROME, MD  [facility] Providers Cardiologist:  Soyla DELENA Merck, MD Cardiology APP:  Madie Jon Garre, PA {  History of Present Illness: .   Cassandra Hicks is a 67 y.o. female w/PMHx of  HTN, GERD AFib  Initially found with AFib May 2024 Started on OAC, planned for TEE/DCCV   DCCV 03/28/23  Did well until this fall, saw AFib clinic Aug 2025 with ~ 2 weeks of some SOB > was back in AFib w/RVR and planned for DCCV, BB was increased   DCCV 07/02/24  Saw cards in f/u 07/25/24, again in AFib w/RVR, reported feeling dizzy, lightheaded and referred to the ER In the ER, MD elicited report of a missed dose of her Xarelto , and did not pursue DCCV She was seen by Dr. Ladona, found to be HD stable, and minimally symptomatic > recommended amiodarone  be started 200 mg twice daily for 2 weeks followed by 200 mg once a day, with no expectation of CV via drug, though should give some additional rate control > to see EP out patient to consider TEE/DCCV on AAD > perhaps ablation as long term strategy    Today's visit is scheduled to discussed management strategies ROS:   She is accompanied by her husband In AFib she feels poorly Tired, no energy, winded easily  When not in AFib feels well She is active, typically with good exertional capacity She worked a gold tournament out in the heat some days upwards of 12 hours or so, prior to going back into AFib this time, wonders if it may have triggered it.  No CP Near syncope or syncope No bleeding  She is certain of no missed doses of Xarelto  since her ER visit (9/17)   Arrhythmia/AAD hx Amiodarone  started 07/25/24  Studies Reviewed: SABRA    EKG done today and reviewed by myself:  AFib 100bpm  Echo 06/20/24   1. Left ventricular ejection fraction, by estimation, is 55 to 60%. The  left ventricle has normal function. The  left ventricle has no regional  wall motion abnormalities. Left ventricular diastolic function could not  be evaluated.   2. Right ventricular systolic function is normal. The right ventricular  size is normal.   3. Left atrial size was mildly dilated.   4. The mitral valve is normal in structure. Mild mitral valve  regurgitation. No evidence of mitral stenosis.   5. The aortic valve is tricuspid. Aortic valve regurgitation is trivial.  No aortic stenosis is present.   6. The inferior vena cava is normal in size with greater than 50%  respiratory variability, suggesting right atrial pressure of 3 mmHg.   Risk Assessment/Calculations:    Physical Exam:   VS:  There were no vitals taken for this visit.   Wt Readings from Last 3 Encounters:  07/25/24 178 lb 12.8 oz (81.1 kg)  07/02/24 175 lb (79.4 kg)  06/25/24 178 lb 3.2 oz (80.8 kg)    GEN: Well nourished, well developed in no acute distress NECK: No JVD; No carotid bruits CARDIAC: irreg-irreg, no murmurs, rubs, gallops RESPIRATORY:  CTA b/l without rales, wheezing or rhonchi  ABDOMEN: Soft, non-tender, non-distended EXTREMITIES: No edema; No deformity   ASSESSMENT AND PLAN: .    persistent AFib CHA2DS2Vasc is 3, on xarelto , appropriately dosed Amiodarone  started 07/25/24 Rate is much improved She is symptomatic with  AFib  Long discussion today about what AFib is Risk of stroke, her risk score and importance of her Xarelto /role of anticoagulation. Discussed role of amiodarone , and ablation. I think she would be a good ablation candidate She is 3 weeks uninterrupted a/c Will plan DCCV for next week I have messaged the EP scheduler to hold her a spot for AFib ablation (pt knows this will realistically be Jan)  Secondary hypercoagulable state 2/2 AFib     Informed Consent   Shared Decision Making/Informed Consent The risks (stroke, cardiac arrhythmias rarely resulting in the need for a temporary or permanent pacemaker,  skin irritation or burns and complications associated with conscious sedation including aspiration, arrhythmia, respiratory failure and death), benefits (restoration of normal sinus rhythm) and alternatives of a direct current cardioversion were explained in detail to Ms. Surace and she agrees to proceed.     Dispo: back in ~ 66mo or so to see Dr. Inocencio (MD only) to discuss ablation further   Signed, Charlies Macario Arthur, PA-C

## 2024-08-17 ENCOUNTER — Ambulatory Visit: Attending: Physician Assistant | Admitting: Physician Assistant

## 2024-08-17 ENCOUNTER — Encounter: Payer: Self-pay | Admitting: Physician Assistant

## 2024-08-17 ENCOUNTER — Encounter: Payer: Self-pay | Admitting: *Deleted

## 2024-08-17 VITALS — BP 112/78 | HR 100 | Ht 61.0 in | Wt 181.4 lb

## 2024-08-17 DIAGNOSIS — I4819 Other persistent atrial fibrillation: Secondary | ICD-10-CM | POA: Diagnosis not present

## 2024-08-17 DIAGNOSIS — D6869 Other thrombophilia: Secondary | ICD-10-CM | POA: Diagnosis not present

## 2024-08-17 NOTE — Patient Instructions (Addendum)
 Medication Instructions:   Your physician recommends that you continue on your current medications as directed. Please refer to the Current Medication list given to you today.   *If you need a refill on your cardiac medications before your next appointment, please call your pharmacy*   Lab Work:  PLEASE GO DOWN STAIRS  [facility]  FIRST FLOOR   ( GET OFF ELEVATORS WALK TOWARDS WAITING AREA LAB LOCATED BY PHARMACY  CMET CBC AN TSH  TODAY      If you have labs (blood work) drawn today and your tests are completely normal, you will receive your results only by: MyChart Message (if you have MyChart) OR A paper copy in the mail If you have any lab test that is abnormal or we need to change your treatment, we will call you to review the results.   Testing/Procedures:   SEE  LETTER ATTACHED  ON 08-21-24 Your physician has recommended that you have a Cardioversion (DCCV). Electrical Cardioversion uses a jolt of electricity to your heart either through paddles or wired patches attached to your chest. This is a controlled, usually prescheduled, procedure. Defibrillation is done under light anesthesia in the hospital, and you usually go home the day of the procedure. This is done to get your heart back into a normal rhythm. You are not awake for the procedure. Please see the instruction sheet given to you today.     Follow-Up: At [facility], you and your health needs are our priority.  As part of our continuing mission to provide you with exceptional heart care, our providers are all part of one team.  This team includes your primary Cardiologist (physician) and Advanced Practice Providers or APPs (Physician Assistants and Nurse Practitioners) who all work together to provide you with the care you need, when you need it.  Your next appointment:    1 month(s)  Provider:    Soyla Norton, MD ONLY!!!   ( CONTACT  CASSIE Petties/ ANGELINE HAMMER FOR EP SCHEDULING ISSUES )    We recommend signing  up for the patient portal called MyChart.  Sign up information is provided on this After Visit Summary.  MyChart is used to connect with patients for Virtual Visits (Telemedicine).  Patients are able to view lab/test results, encounter notes, upcoming appointments, etc.  Non-urgent messages can be sent to your provider as well.   To learn more about what you can do with MyChart, go to [email or web address].   Other Instructions        You are scheduled for a Cardioversion   on 08-21-24  with Dr. Mona . Please arrive at the [facility] at  1:30 p.m. on the day of your procedure scheduled at 2:30 pm.  DIET:            A) Nothing to eat or drink after midnight except your medications with a                  sip of water.            B) May have clear liquid breakfast, then nothing to eat or drink after                    5am   Clear liquids include: water, broth, Sprite, Ginger Ale,                   Black  coffee, tea (no sugar), cranberry /  grape / apple juice, jello (not                      red),  popsicle from clear juices (not red).  Complete labs on FIRST FLOOR  [address]   MAKE SURE YOU TAKE YOUR XARELTO   YOU MAY TAKE ALL of your remaining medications with a small amount of water.   Must have a responsible person to drive you home.  Bring a current list of your medications and current insurance cards.   * Special note: Every effort is made to have your procedure done on time. Occasionally there are emergencies that present themselves at the hospital that may cause delays. Please be patient if a delay does occur.  If you have any questions after you get home, please call the office at  [phone]

## 2024-08-18 LAB — CBC
Hematocrit: 42.2 % (ref 34.0–46.6)
Hemoglobin: 13.7 g/dL (ref 11.1–15.9)
MCH: 32.2 pg (ref 26.6–33.0)
MCHC: 32.5 g/dL (ref 31.5–35.7)
MCV: 99 fL — ABNORMAL HIGH (ref 79–97)
Platelets: 256 x10E3/uL (ref 150–450)
RBC: 4.25 x10E6/uL (ref 3.77–5.28)
RDW: 11.9 % (ref 11.7–15.4)
WBC: 8.6 x10E3/uL (ref 3.4–10.8)

## 2024-08-18 LAB — COMPREHENSIVE METABOLIC PANEL WITH GFR
ALT: 17 IU/L (ref 0–32)
AST: 19 IU/L (ref 0–40)
Albumin: 4.4 g/dL (ref 3.9–4.9)
Alkaline Phosphatase: 84 IU/L (ref 49–135)
BUN/Creatinine Ratio: 13 (ref 12–28)
BUN: 14 mg/dL (ref 8–27)
Bilirubin Total: 0.5 mg/dL (ref 0.0–1.2)
CO2: 25 mmol/L (ref 20–29)
Calcium: 9.1 mg/dL (ref 8.7–10.3)
Chloride: 103 mmol/L (ref 96–106)
Creatinine, Ser: 1.06 mg/dL — ABNORMAL HIGH (ref 0.57–1.00)
Globulin, Total: 2.5 g/dL (ref 1.5–4.5)
Glucose: 97 mg/dL (ref 70–99)
Potassium: 3.9 mmol/L (ref 3.5–5.2)
Sodium: 142 mmol/L (ref 134–144)
Total Protein: 6.9 g/dL (ref 6.0–8.5)
eGFR: 58 mL/min/1.73 — ABNORMAL LOW (ref >59)

## 2024-08-18 LAB — TSH: TSH: 3.34 u[IU]/mL (ref 0.450–4.500)

## 2024-08-20 ENCOUNTER — Ambulatory Visit: Payer: Self-pay | Admitting: Physician Assistant

## 2024-08-20 ENCOUNTER — Telehealth: Payer: Self-pay

## 2024-08-20 NOTE — Progress Notes (Signed)
 Pt called for pre procedure instructions. Arrival time 1300 NPO after midnight explained Instructed to take am meds with sip of water and confirmed blood thinner consistency Instructed pt need for ride home tomorrow and have responsible adult with them for 24 hrs post procedure.

## 2024-08-20 NOTE — Progress Notes (Signed)
 Left VM for return call to review instructions for appointment on 08/21/2024.

## 2024-08-20 NOTE — Telephone Encounter (Signed)
 Called pt to inform her that per Renee I had been ask to hold a spot for her to have an Afib Ablation with Dr. Inocencio. She is scheduled for a DCCV tomorrow 10/14 and is hoping she doesn't need the Ablation but I explained to her that we normally book out several months ahead so she agreed to hold a date in January.   She agreed on 1/6 - I have put her name on the calendar but not doing anything else until she is seen in clinic by Dr. Inocencio on 11/11. She will decide at that time if she wants to proceed with the Ablation.

## 2024-08-21 ENCOUNTER — Other Ambulatory Visit: Payer: Self-pay

## 2024-08-21 ENCOUNTER — Ambulatory Visit (HOSPITAL_COMMUNITY)

## 2024-08-21 ENCOUNTER — Ambulatory Visit (HOSPITAL_COMMUNITY)
Admission: RE | Admit: 2024-08-21 | Discharge: 2024-08-21 | Disposition: A | Discharge status: Home / Self Care | Attending: Internal Medicine | Admitting: Internal Medicine

## 2024-08-21 ENCOUNTER — Encounter (HOSPITAL_COMMUNITY)
Admission: RE | Disposition: A | Discharge status: Home / Self Care | Payer: Self-pay | Source: Home / Self Care | Attending: Internal Medicine

## 2024-08-21 DIAGNOSIS — Z87891 Personal history of nicotine dependence: Secondary | ICD-10-CM | POA: Diagnosis not present

## 2024-08-21 DIAGNOSIS — Z7901 Long term (current) use of anticoagulants: Secondary | ICD-10-CM | POA: Insufficient documentation

## 2024-08-21 DIAGNOSIS — I4819 Other persistent atrial fibrillation: Secondary | ICD-10-CM | POA: Diagnosis not present

## 2024-08-21 DIAGNOSIS — F419 Anxiety disorder, unspecified: Secondary | ICD-10-CM

## 2024-08-21 DIAGNOSIS — D6869 Other thrombophilia: Secondary | ICD-10-CM | POA: Insufficient documentation

## 2024-08-21 DIAGNOSIS — I4891 Unspecified atrial fibrillation: Secondary | ICD-10-CM

## 2024-08-21 DIAGNOSIS — I10 Essential (primary) hypertension: Secondary | ICD-10-CM | POA: Insufficient documentation

## 2024-08-21 HISTORY — PX: CARDIOVERSION: EP1203

## 2024-08-21 SURGERY — CARDIOVERSION (CATH LAB)
Anesthesia: General

## 2024-08-21 MED ORDER — PROPOFOL 10 MG/ML IV BOLUS
INTRAVENOUS | Status: DC | PRN
  Administered 2024-08-21: 50 mg via INTRAVENOUS

## 2024-08-21 MED ORDER — SODIUM CHLORIDE 0.9% FLUSH: 3.0000 mL | Freq: Two times a day (BID) | INTRAVENOUS | Status: DC

## 2024-08-21 MED ORDER — SODIUM CHLORIDE 0.9% FLUSH: 3.0000 mL | INTRAVENOUS | Status: DC | PRN

## 2024-08-21 MED ORDER — SODIUM CHLORIDE 0.9 % IV SOLN: 250.0000 mL | INTRAVENOUS | Status: DC | PRN

## 2024-08-21 SURGICAL SUPPLY — 1 items: PAD DEFIB RADIO PHYSIO CONN (PAD) ×2 IMPLANT

## 2024-08-21 NOTE — Discharge Instructions (Signed)

## 2024-08-21 NOTE — CV Procedure (Signed)
    CARDIOVERSION NOTE  Procedure: Electrical Cardioversion Indications:  Atrial Fibrillation  Procedure Details:  Consent: Risks of procedure as well as the alternatives and risks of each were explained to the (patient/caregiver).  Consent for procedure obtained.  Time Out: Verified patient identification, verified procedure, site/side was marked, verified correct patient position, special equipment/implants available, medications/allergies/relevent history reviewed, required imaging and test results available.  Performed  Patient placed on cardiac monitor, pulse oximetry, supplemental oxygen as necessary.  Sedation given: propofol  per anesthesia Pacer pads placed anterior and posterior chest.  Cardioverted 1 time(s).  Cardioverted at 300J biphasic.  Impression: Findings: Post procedure EKG shows: NSR Complications: None Patient did tolerate procedure well.  Plan: Successful DCCV after 1 shock at 300J to NSR.  Time Spent Directly with the Patient:  30 minutes   Vinie KYM Maxcy, MD, [facility], FNLA, FACP  [place]  [facility]  Medical Director of the Advanced Lipid Disorders &  Cardiovascular Risk Reduction Clinic Diplomate of the American Board of Clinical Lipidology Attending Cardiologist  Direct Dial: [phone]  Fax: [phone]  Website:  [email or web address] Jaycelyn Orrison 08/21/2024, 2:38 PM

## 2024-08-21 NOTE — Transfer of Care (Signed)
 Immediate Anesthesia Transfer of Care Note  Patient: Cassandra Hicks  Procedure(s) Performed: CARDIOVERSION  Patient Location: PACU and Cath Lab  Anesthesia Type:General  Level of Consciousness: awake and alert   Airway & Oxygen Therapy: Patient Spontanous Breathing  Post-op Assessment: Report given to RN and Post -op Vital signs reviewed and stable  Post vital signs: stable  Last Vitals:  Vitals Value Taken Time  BP    Temp    Pulse    Resp    SpO2      Last Pain:  Vitals:   08/21/24 1331  TempSrc:   PainSc: 0-No pain         Complications: No notable events documented.

## 2024-08-21 NOTE — Interval H&P Note (Signed)
 History and Physical Interval Note:  08/21/2024 2:21 PM  Cassandra Hicks  has presented today for surgery, with the diagnosis of AFIB.  The various methods of treatment have been discussed with the patient and family. After consideration of risks, benefits and other options for treatment, the patient has consented to  Procedure(s): CARDIOVERSION (N/A) as a surgical intervention.  The patient's history has been reviewed, patient examined, no change in status, stable for surgery.  I have reviewed the patient's chart and labs.  Questions were answered to the patient's satisfaction.     [name]

## 2024-08-21 NOTE — Anesthesia Preprocedure Evaluation (Signed)
 Anesthesia Evaluation  Patient identified by MRN, date of birth, ID band Patient awake    Reviewed: Allergy & Precautions, NPO status , Patient's Chart, lab work & pertinent test results  History of Anesthesia Complications Negative for: history of anesthetic complications  Airway Mallampati: IV  TM Distance: >3 FB Neck ROM: Full    Dental  (+) Missing, Dental Advisory Given,    Pulmonary former smoker   breath sounds clear to auscultation       Cardiovascular hypertension, Pt. on medications and Pt. on home beta blockers (-) angina (-) Past MI + dysrhythmias Atrial Fibrillation + Valvular Problems/Murmurs (mild) MR  Rhythm:Irregular  06/20/2024 TTE  1. Left ventricular ejection fraction, by estimation, is 55 to 60%. The  left ventricle has normal function. The left ventricle has no regional  wall motion abnormalities. Left ventricular diastolic function could not  be evaluated.   2. Right ventricular systolic function is normal. The right ventricular  size is normal.   3. Left atrial size was mildly dilated.   4. The mitral valve is normal in structure. Mild mitral valve  regurgitation. No evidence of mitral stenosis.   5. The aortic valve is tricuspid. Aortic valve regurgitation is trivial.  No aortic stenosis is present.   6. The inferior vena cava is normal in size with greater than 50%  respiratory variability, suggesting right atrial pressure of 3 mmHg.      Neuro/Psych   Anxiety        GI/Hepatic Neg liver ROS,GERD  ,,  Endo/Other    Renal/GU negative Renal ROS  negative genitourinary   Musculoskeletal negative musculoskeletal ROS (+)    Abdominal   Peds  Hematology   Anesthesia Other Findings All PCN  Reproductive/Obstetrics                              Anesthesia Physical Anesthesia Plan  ASA: 3  Anesthesia Plan: General   Post-op Pain Management:    Induction:  Intravenous  PONV Risk Score and Plan: Treatment may vary due to age or medical condition  Airway Management Planned: Nasal Cannula, Natural Airway and Simple Face Mask  Additional Equipment: None  Intra-op Plan:   Post-operative Plan:   Informed Consent: I have reviewed the patients History and Physical, chart, labs and discussed the procedure including the risks, benefits and alternatives for the proposed anesthesia with the patient or authorized representative who has indicated his/her understanding and acceptance.     Dental advisory given  Plan Discussed with: CRNA  Anesthesia Plan Comments:          Anesthesia Quick Evaluation

## 2024-08-22 ENCOUNTER — Encounter (HOSPITAL_COMMUNITY): Payer: Self-pay | Admitting: Internal Medicine

## 2024-08-23 NOTE — Anesthesia Postprocedure Evaluation (Signed)
 Anesthesia Post Note  Patient: Cassandra Hicks  Procedure(s) Performed: CARDIOVERSION     Patient location during evaluation: Cath Lab Anesthesia Type: General Level of consciousness: awake and alert Pain management: pain level controlled Vital Signs Assessment: post-procedure vital signs reviewed and stable Respiratory status: spontaneous breathing, nonlabored ventilation and respiratory function stable Cardiovascular status: blood pressure returned to baseline and stable Postop Assessment: no apparent nausea or vomiting Anesthetic complications: no   No notable events documented.            Brittannie Tawney

## 2024-08-24 ENCOUNTER — Emergency Department (HOSPITAL_COMMUNITY)

## 2024-08-24 ENCOUNTER — Emergency Department (HOSPITAL_COMMUNITY)
Admission: EM | Admit: 2024-08-24 | Discharge: 2024-08-24 | Disposition: A | Discharge status: Home / Self Care | Attending: Emergency Medicine | Admitting: Emergency Medicine

## 2024-08-24 ENCOUNTER — Encounter (HOSPITAL_COMMUNITY): Payer: Self-pay

## 2024-08-24 ENCOUNTER — Telehealth: Payer: Self-pay | Admitting: Physician Assistant

## 2024-08-24 DIAGNOSIS — N1831 Chronic kidney disease, stage 3a: Secondary | ICD-10-CM | POA: Insufficient documentation

## 2024-08-24 DIAGNOSIS — R0602 Shortness of breath: Secondary | ICD-10-CM | POA: Diagnosis present

## 2024-08-24 DIAGNOSIS — I4819 Other persistent atrial fibrillation: Secondary | ICD-10-CM | POA: Diagnosis not present

## 2024-08-24 DIAGNOSIS — Z87891 Personal history of nicotine dependence: Secondary | ICD-10-CM | POA: Insufficient documentation

## 2024-08-24 DIAGNOSIS — R001 Bradycardia, unspecified: Secondary | ICD-10-CM

## 2024-08-24 DIAGNOSIS — Z79899 Other long term (current) drug therapy: Secondary | ICD-10-CM | POA: Insufficient documentation

## 2024-08-24 DIAGNOSIS — Z7901 Long term (current) use of anticoagulants: Secondary | ICD-10-CM | POA: Insufficient documentation

## 2024-08-24 DIAGNOSIS — I129 Hypertensive chronic kidney disease with stage 1 through stage 4 chronic kidney disease, or unspecified chronic kidney disease: Secondary | ICD-10-CM | POA: Diagnosis not present

## 2024-08-24 DIAGNOSIS — I5031 Acute diastolic (congestive) heart failure: Secondary | ICD-10-CM | POA: Diagnosis not present

## 2024-08-24 HISTORY — DX: Unspecified atrial fibrillation: I48.91

## 2024-08-24 LAB — BASIC METABOLIC PANEL WITH GFR
Anion gap: 11 (ref 5–15)
BUN: 11 mg/dL (ref 8–23)
CO2: 23 mmol/L (ref 22–32)
Calcium: 9 mg/dL (ref 8.9–10.3)
Chloride: 108 mmol/L (ref 98–111)
Creatinine, Ser: 1.15 mg/dL — ABNORMAL HIGH (ref 0.44–1.00)
GFR, Estimated: 53 mL/min — ABNORMAL LOW (ref >60)
Glucose, Bld: 93 mg/dL (ref 70–99)
Potassium: 4 mmol/L (ref 3.5–5.1)
Sodium: 142 mmol/L (ref 135–145)

## 2024-08-24 LAB — CBC
HCT: 37.4 % (ref 36.0–46.0)
Hemoglobin: 12.5 g/dL (ref 12.0–15.0)
MCH: 32.4 pg (ref 26.0–34.0)
MCHC: 33.4 g/dL (ref 30.0–36.0)
MCV: 96.9 fL (ref 80.0–100.0)
Platelets: 220 K/uL (ref 150–400)
RBC: 3.86 MIL/uL — ABNORMAL LOW (ref 3.87–5.11)
RDW: 12.5 % (ref 11.5–15.5)
WBC: 8.3 K/uL (ref 4.0–10.5)
nRBC: 0 % (ref 0.0–0.2)

## 2024-08-24 LAB — MAGNESIUM: Magnesium: 2 mg/dL (ref 1.7–2.4)

## 2024-08-24 LAB — TROPONIN I (HIGH SENSITIVITY)
Troponin I (High Sensitivity): 5 ng/L (ref <18)
Troponin I (High Sensitivity): 5 ng/L (ref <18)

## 2024-08-24 LAB — BRAIN NATRIURETIC PEPTIDE: B Natriuretic Peptide: 310.1 pg/mL — ABNORMAL HIGH (ref 0.0–100.0)

## 2024-08-24 MED ORDER — RIVAROXABAN 10 MG PO TABS
20.0000 mg | ORAL_TABLET | Freq: Every day | ORAL | Status: DC
  Administered 2024-08-24: 20 mg via ORAL
  Filled 2024-08-24: qty 2

## 2024-08-24 MED ORDER — METOPROLOL SUCCINATE ER 50 MG PO TB24: 50.0000 mg | ORAL_TABLET | Freq: Every day | ORAL | 2 refills | Status: DC

## 2024-08-24 MED ORDER — FUROSEMIDE 20 MG PO TABS: 20.0000 mg | ORAL_TABLET | ORAL | 0 refills | Status: DC

## 2024-08-24 MED ORDER — IOHEXOL 350 MG/ML SOLN
75.0000 mL | Freq: Once | INTRAVENOUS | Status: AC | PRN
  Administered 2024-08-24: 75 mL via INTRAVENOUS

## 2024-08-24 MED ORDER — FUROSEMIDE 20 MG PO TABS
20.0000 mg | ORAL_TABLET | Freq: Once | ORAL | Status: AC
  Administered 2024-08-24: 20 mg via ORAL
  Filled 2024-08-24: qty 1

## 2024-08-24 NOTE — Discharge Instructions (Signed)
 Please take the water pill as instructed by cardiology as well as a decreased dose of your metoprolol .  Please follow-up with your cardiologist team in clinic.  Call to schedule an appointment.  Return if you have fevers, chills, lightheadedness, passout, chest pain, shortness of breath or any new or worsening symptoms that are concerning to you.

## 2024-08-24 NOTE — ED Provider Notes (Signed)
 Clinical Course as of 08/24/24 1806  Fri Aug 24, 2024  1729 Received signout; disposition pending cardiology evaluation. [TY]  1730 Cardiology has evaluated patient.  Think that she can go.  Giving a dose of medications.  They will give short course of Lasix and decrease her metoprolol .  Close outpatient follow-up. [TY]  1805 Patient continues to be hemodynamically stable.  On my exam she is asking to leave.  Does not appear to be in distress.  Discharged in stable condition at the recommendation of cardiology.  [TY]    Clinical Course User Index [TY] Cassandra Caron PARAS, DO      Cassandra Hicks, [place] 08/24/24 1806

## 2024-08-24 NOTE — ED Provider Notes (Signed)
 Emergency Department Provider Note   I have reviewed the triage vital signs and the nursing notes.   HISTORY  Chief Complaint Shortness of Breath, Fatigue, and Chest Pain   HPI Cassandra Hicks is a 68 y.o. female presents to the emergency department with exertional dyspnea following her cardioversion on Tuesday.  She was seen by Dr. Mona and cardioverted with A-fib.  The procedure went well but since that time she has noticed fatigue and shortness of breath.  Symptoms are worse with movement.  She has not noticed significant orthopnea.  No severe chest pains.  She has been compliant with her home medications including her Xarelto .   Past Medical History:  Diagnosis Date   A-fib (HCC)    Abnormal mammogram of left breast 09/17/2016   Anxiety    Breast mass, left    GERD (gastroesophageal reflux disease)    mild, no meds, diet controlled   Heart murmur    as child, no problem   Hypertension    Missed ab    x 2  D&E   SVD (spontaneous vaginal delivery)    x 2    Review of Systems  Constitutional: No fever/chills Cardiovascular: Positive chest pain. Respiratory: Positive shortness of breath. Gastrointestinal: No abdominal pain.  No nausea, no vomiting.  Neurological: Negative for headaches.  ____________________________________________   PHYSICAL EXAM:  VITAL SIGNS: ED Triage Vitals  Encounter Vitals Group     BP 08/24/24 0959 (!) 158/89     Pulse Rate 08/24/24 0959 (!) 56     Resp 08/24/24 0959 17     Temp 08/24/24 0959 98.2 F (36.8 C)     Temp src --      SpO2 08/24/24 0959 96 %   Constitutional: Alert and oriented. Well appearing and in no acute distress. Eyes: Conjunctivae are normal.  Head: Atraumatic. Nose: No congestion/rhinnorhea. Mouth/Throat: Mucous membranes are moist. Neck: No stridor.  Cardiovascular: Sinus bradycardia. Good peripheral circulation. Grossly normal heart sounds.   Respiratory: Normal respiratory effort.  No retractions. Lungs  CTAB. Gastrointestinal: Soft and nontender. No distention.  Musculoskeletal: No lower extremity tenderness with trace pitting edema. No gross deformities of extremities. Neurologic:  Normal speech and language. No gross focal neurologic deficits are appreciated.  Skin:  Skin is warm, dry and intact. No rash noted. ____________________________________________   LABS (all labs ordered are listed, but only abnormal results are displayed)  Labs Reviewed  BASIC METABOLIC PANEL WITH GFR - Abnormal; Notable for the following components:      Result Value   Creatinine, Ser 1.15 (*)    GFR, Estimated 53 (*)    All other components within normal limits  CBC - Abnormal; Notable for the following components:   RBC 3.86 (*)    All other components within normal limits  BRAIN NATRIURETIC PEPTIDE - Abnormal; Notable for the following components:   B Natriuretic Peptide 310.1 (*)    All other components within normal limits  MAGNESIUM  TROPONIN I (HIGH SENSITIVITY)  TROPONIN I (HIGH SENSITIVITY)   ____________________________________________  EKG  Rate: 53 PR: 202 Sinus bradycardia. Narrow QRS. No STEMI.  ____________________________________________  RADIOLOGY  DG Chest 2 View Result Date: 08/24/2024 CLINICAL DATA:  Shortness of breath.  Fatigue. EXAM: CHEST - 2 VIEW COMPARISON:  07/25/2024 FINDINGS: Borderline enlarged heart. Mildly tortuous aorta. Mildly prominent pulmonary vasculature. Mild thoracic spine degenerative changes. IMPRESSION: Borderline cardiomegaly and mild pulmonary vascular congestion. Electronically Signed   By: Elspeth Robynn HERO.D.  On: 08/24/2024 11:20    ____________________________________________   PROCEDURES  Procedure(s) performed:   Procedures   ____________________________________________   INITIAL IMPRESSION / ASSESSMENT AND PLAN / ED COURSE  Pertinent labs & imaging results that were available during my care of the patient were reviewed by me and  considered in my medical decision making (see chart for details).   This patient is Presenting for Evaluation of CP/SOB, which does require a range of treatment options, and is a complaint that involves a high risk of morbidity and mortality.  The Differential Diagnoses includes but is not exclusive to acute coronary syndrome, aortic dissection, pulmonary embolism, cardiac tamponade, community-acquired pneumonia, pericarditis, musculoskeletal chest wall pain, etc.   Critical Interventions-    Medications  iohexol (OMNIPAQUE) 350 MG/ML injection 75 mL (75 mLs Intravenous Contrast Given 08/24/24 1353)    Reassessment after intervention:     I decided to review pertinent External Data, and in summary patient with cardioversion on 10/14 with Dr. Mona.    Clinical Laboratory Tests Ordered, included troponin normal.  BNP minimally elevated at 310.  No AKI.  Mag normal.   Radiologic Tests Ordered, included CXR and CTA PE. I independently interpreted the images and agree with radiology interpretation.   Cardiac Monitor Tracing which shows sinus bradycardia.   Social Determinants of Health Risk patient is not an active smoker.   Consult complete with Cardiology.   Medical Decision Making: Summary:  Patient presents to the emergency department with exertional dyspnea and chest tightness which are new since her cardioversion earlier this week.  She does not appear severely volume overloaded.  At rest she is comfortable and speaking in full sentences.  Chest x-ray shows some borderline vascular congestion and cardiomegaly.  Plan for CT angio PE although she is low risk given her anticoagulated status I will also reach out to cardiology for consultation.  Reevaluation with update and discussion with   ***Considered admission***  Patient's presentation is most consistent with acute presentation with potential threat to life or bodily function.   Disposition:    ____________________________________________  FINAL CLINICAL IMPRESSION(S) / ED DIAGNOSES  Final diagnoses:  None     NEW OUTPATIENT MEDICATIONS STARTED DURING THIS VISIT:  New Prescriptions   No medications on file    Note:  This document was prepared using Dragon voice recognition software and may include unintentional dictation errors.  Fonda Law, MD, [facility] Emergency Medicine

## 2024-08-24 NOTE — ED Provider Triage Note (Signed)
 Emergency Medicine Provider Triage Evaluation Note  Cassandra Hicks , a 68 y.o. female  was evaluated in triage.  Pt complains of chest pressure, lightheadedness, and slow heart rate noted on her pulse ox at home from 45-55 bpm. She had cardioversion for afib on Tuesday and has felt this way since then. No LOC. No leg swelling, nausea/vomiting. Felt like she was gasping for air last night while she was trying to sleep. No changes to her medications. Takes metoprolol  succinate 100 mg every day and amiodarone  200 mg every day. Didn't take those medications this AM. Didn't take too much of them.   Review of Systems  Positive: CP, lightheadedness, bradycardia Negative: Cough, nausea/vomiting, leg swelling  Physical Exam  BP (!) 158/89 (BP Location: Left Arm)   Pulse (!) 56   Temp 98.2 F (36.8 C)   Resp 17   SpO2 96%  Gen:   Awake, no distress   Resp:  Normal effort  Cardiac: Regular bradycardic rate MSK:   Moves extremities without difficulty   Medical Decision Making  Medically screening exam initiated at 11:28 AM.  Appropriate orders placed.  ADIA CRAMMER was informed that the remainder of the evaluation will be completed by another provider, this initial triage assessment does not replace that evaluation, and the importance of remaining in the ED until their evaluation is complete.  EKG w/ sinus bradycardia with no block noted. Patient w/ BP 158/89. Labs ordered. CXR w/ borderline cardiomegaly and vascular congestion. Will order telemetry/pulse ox and move patient to treatment space.    Franklyn Sid SAILOR, MD 08/24/24 1131

## 2024-08-24 NOTE — ED Triage Notes (Signed)
 Pt. Stated, I had a cardioversion on Tuesday and then I became having SOB even when I was sleep. Im so fatigue.

## 2024-08-24 NOTE — Consult Note (Addendum)
 Cardiology Consultation   Patient ID: Cassandra Hicks MRN: [id number]; DOB: 02/21/1956  Admit date: 08/24/2024 Date of Consult: 08/24/2024  PCP:  Burney Darice CROME, MD   [facility] Providers Cardiologist:  Soyla DELENA Merck, MD  Cardiology APP:  Madie Jon Garre, PA       Patient Profile: Cassandra Hicks is a 68 y.o. female with a hx of persistent atrial fibrillation s/p recent cardioversions, HTN, rheumatic fever in childhood, episodic ETOH use (6-8 beers q2-3 weeks) GERD, former tobacco abuse, mild mitral regurgitation by echo 06/2024, CKD 3a by labs, obesity, snoring who is being seen 08/24/2024 for the evaluation of shortness of breath at the request of Dr. Darra.  History of Present Illness:  Cassandra Hicks was first seen by Dr. Alvan in 03/2023 for evaluation of atrial fibrillation. She reports the arrhythmia was first identified by a trainer in 12/2022 and she was subsequently seen by primary care. At time of first cardiac eval, she had been taking Xarelto  15mg  samples; primary care had recommended 20mg  daily. At [facility], she was symptomatic in RVR therefore TEE/DCCV was pursued 03/28/23 with EF 60-65% with mild MR with successful cardioversion to NSR with PACs. 2D echo 04/2023 in NSR showed EF 55-60%, mild MR, normal LA size. At f/u 06/2023 and 12/2023 she was SB in 22s.    She had a repeat echocardiogram done for MR surveillance 06/2024 showing EF 55-60%, mild MR, mild LAE but was back in atrial fibrillation. At that time she also noted recurrent SOB. She was seen by the atrial fib clinic in RVR who increased Toprol  and arranged f/u DCCV 07/02/24 to NSR. However, when seen back in follow-up 07/2024 she was back in AF RVR with lightheadedness, dizziness, and worsening LE edema as well so was sent to the hospital for evaluation. She was given IV diltiazem  but developed hypotension, though HR improved. She was felt stable for discharge on oral amiodarone  and higher dose metoprolol . Sleep  study also being pursued by primary care. She was seen by EP APP 08/17/24 who recommended cardioversion and referral for ablation. She underwent repeat DCCV to NSR 08/21/24.   When she got home from recent DCCV, she felt as though she didn't bounce back or feel as well as she previously had from other cardioversions. She felt fatigued. Over the last few days she has continued to notice easy fatiguability with DOE going up steps. Last night she also had orthopnea, chest tightness, wheezing, and some sensation of needing to gasp. She has also noticed bendopnea. No syncope or pre-syncope. She has had rare jolt of discomfort in chest but no overt angina. Edema has not recurred. She called the office and was recommended to proceed to the ER. CXR shows borderline cardiomegaly and mild pulmonary vascular congestion. CTA shows no evidence of PE, + tiny bilateral pleural effusions and smooth septal thickening, reflux of contrast in the hepatic veins can be seen with elevated right heart pressures, atherosclerosis, most consistent with mild congestive heart failure, 10 mm superior segment of the left lower lobe pulmonary nodule. Per consensus criteria, potential clinical strategies include 3-month follow-up CT, PET CT, or biopsy, aortic atherosclerosis and tiny hiatal hernia. Labs show hsTroponin negx2, stable CBC, BNP 310, Cr 1.15 c/w prior. HR is in the mid to upper 40s-50s in ED in sinus. She did not take any of her home meds yet today as the office advised to hold off pending ED eval this AM.   Past Medical History:  Diagnosis Date   A-fib [facility])    Abnormal mammogram of left breast 09/17/2016   Anxiety    Breast mass, left    GERD (gastroesophageal reflux disease)    mild, no meds, diet controlled   Heart murmur    as child, no problem   Hypertension    Missed ab    x 2  D&E   SVD (spontaneous vaginal delivery)    x 2    Past Surgical History:  Procedure Laterality Date   ABDOMINAL HYSTERECTOMY  N/A 12/09/2014   Procedure: HYSTERECTOMY ABDOMINAL;  Surgeon: Charlie CHRISTELLA Croak, MD;  Location: [facility];  Service: Gynecology;  Laterality: N/A;   APPENDECTOMY     BREAST BIOPSY Left 08/10/2016   BREAST EXCISIONAL BIOPSY Left 09/2016   BREAST LUMPECTOMY WITH RADIOACTIVE SEED LOCALIZATION Left 09/17/2016   Procedure: LEFT BREAST LUMPECTOMY WITH RADIOACTIVE SEED LOCALIZATION;  Surgeon: Elon Pacini, MD;  Location: [facility];  Service: General;  Laterality: Left;   CARDIOVERSION N/A 03/28/2023   Procedure: CARDIOVERSION;  Surgeon: Pietro Redell RAMAN, MD;  Location: [facility];  Service: Cardiovascular;  Laterality: N/A;   CARDIOVERSION N/A 07/02/2024   Procedure: CARDIOVERSION;  Surgeon: Loni Soyla LABOR, MD;  Location: [facility];  Service: Cardiovascular;  Laterality: N/A;   CARDIOVERSION N/A 08/21/2024   Procedure: CARDIOVERSION;  Surgeon: Mona Vinie BROCKS, MD;  Location: [facility];  Service: Cardiovascular;  Laterality: N/A;   COLONOSCOPY     DIAGNOSTIC LAPAROSCOPY     DILATION AND CURETTAGE OF UTERUS     x 2 for MAB   ECTOPIC PREGNANCY SURGERY     x 2   EYE SURGERY     bilateral - Radial Keratotomy    SALPINGOOPHORECTOMY Bilateral 12/09/2014   Procedure: SALPINGO OOPHORECTOMY;  Surgeon: Charlie CHRISTELLA Croak, MD;  Location: [facility];  Service: Gynecology;  Laterality: Bilateral;   TEE WITHOUT CARDIOVERSION N/A 03/28/2023   Procedure: TRANSESOPHAGEAL ECHOCARDIOGRAM;  Surgeon: Pietro Redell RAMAN, MD;  [facility];  Service: Cardiovascular;  Laterality: N/A;   TONSILLECTOMY     TUBAL LIGATION     WISDOM TOOTH EXTRACTION       Home Medications:  Prior to Admission medications   Medication Sig Start Date End Date Taking? Authorizing Provider  acetaminophen  (TYLENOL ) 500 MG tablet Take 1,000 mg by mouth every 6 (six) hours as needed for moderate pain.    [provider]  amiodarone  (PACERONE ) 200 MG tablet Take 1 tablet (200  mg total) by mouth in the morning and at bedtime for 14 days, THEN 1 tablet (200 mg total) daily. Patient taking differently: Pt takes 1 tablet 200 mg once a day. 07/25/24 10/07/24  Ladona Heinz, MD  atorvastatin (LIPITOR) 10 MG tablet Take 10 mg by mouth daily. 07/31/24   [provider]  cholecalciferol (VITAMIN D3) 25 MCG (1000 UNIT) tablet Take 1,000 Units by mouth daily.    [provider]  loratadine (CLARITIN) 10 MG tablet Take 10 mg by mouth daily as needed for allergies.    [provider]  losartan -hydrochlorothiazide  (HYZAAR) 50-12.5 MG tablet Take 0.5 tablets by mouth every morning. 07/25/24   Ladona Heinz, MD  metoprolol  succinate (TOPROL -XL) 100 MG 24 hr tablet Take 1 tablet (100 mg total) by mouth daily. Take with or immediately following a meal. 07/27/24   Duke, Jon Garre, PA  Prenatal Vit-Fe Fumarate-FA (PRENATAL PO) Take 1 tablet by mouth daily.    [provider]  rivaroxaban  (XARELTO ) 20 MG TABS tablet Take 1 tablet (20 mg total) by mouth daily with supper. 06/20/24   Duke, Jon Garre, PA  rosuvastatin (CRESTOR) 10 MG tablet Take 10 mg by mouth daily.    [provider]    Scheduled Meds:  rivaroxaban   20 mg Oral Daily   Continuous Infusions:  PRN Meds:   Allergies:    Allergies  Allergen Reactions   Penicillins Hives    Social History:   Social History   Socioeconomic History   Marital status: Married    Spouse name: Not on file   Number of children: Not on file   Years of education: Not on file   Highest education level: Not on file  Occupational History   Not on file  Tobacco Use   Smoking status: Former    Current packs/day: 0.00    Average packs/day: 1 pack/day for 30.0 years (30.0 ttl pk-yrs)    Types: Cigarettes    Start date: 12/07/1966    Quit date: 12/07/1996    Years since quitting: 27.7   Smokeless tobacco: Never   Tobacco comments:    Former smoker 06/25/24  Substance and Sexual Activity    Alcohol use: Yes    Alcohol/week: 1.0 standard drink of alcohol    Types: 1 Cans of beer per week    Comment: social/weekends   Drug use: Yes    Types: Marijuana    Comment: every   Sexual activity: Yes    Birth control/protection: Post-menopausal  Other Topics Concern   Not on file  Social History Narrative   Not on file   Social Drivers of Health   Financial Resource Strain: Not on file  Food Insecurity: Not on file  Transportation Needs: Not on file  Physical Activity: Not on file  Stress: Not on file  Social Connections: Not on file  Intimate Partner Violence: Not on file    Family History:   Family History  Problem Relation Age of Onset   COPD Mother    Cancer Father        lung   Hypertension Father    Hypertension Sister    Hypertension Brother      ROS:  Please see the history of present illness.  All other ROS reviewed and negative.     Physical Exam/Data: Vitals:   08/24/24 1330 08/24/24 1515 08/24/24 1602 08/24/24 1602  BP: (!) 142/85   (!) 115/99  Pulse: (!) 50 (!) 52  (!) 47  Resp: 16 18  18   Temp:    98.5 F (36.9 C)  TempSrc:    Oral  SpO2: 100% 100%  93%  Weight:   79.4 kg   Height:   5' 1 (1.549 m)    No intake or output data in the 24 hours ending 08/24/24 1723    08/24/2024    4:02 PM 08/21/2024    1:01 PM 08/17/2024    1:27 PM  Last 3 Weights  Weight (lbs) 175 lb 175 lb 181 lb 6.4 oz  Weight (kg) 79.379 kg 79.379 kg 82.283 kg     Body mass index is 33.07 kg/m.  General: Well developed, well nourished, in no acute distress. Head: Normocephalic, atraumatic, sclera non-icteric, no xanthomas, nares are without discharge. Neck: Negative for carotid bruits. JVP not elevated. Lungs: Faint basilar crackles bilaterally to auscultation without wheezes, rales, or rhonchi. Breathing is unlabored. Heart: RRR S1 S2, extremely soft SEM predominantly at RUSB, without rubs  or gallops.  Abdomen: Soft, non-tender, non-distended with  normoactive bowel sounds. No rebound/guarding. Extremities: No clubbing or cyanosis. No edema. Distal pedal pulses are 2+ and equal bilaterally. Neuro: Alert and oriented X 3. Moves all extremities spontaneously. Psych:  Responds to questions appropriately with a normal affect.  EKG:  The EKG was personally reviewed and demonstrates:  sinus bradycardia 53bpm, nonspecific TW changes, QTc  Telemetry:  Telemetry was personally reviewed and demonstrates:  SB-NSR mid 40s-50s  Relevant CV Studies:  2d echo 06/2024   1. Left ventricular ejection fraction, by estimation, is 55 to 60%. The  left ventricle has normal function. The left ventricle has no regional  wall motion abnormalities. Left ventricular diastolic function could not  be evaluated.   2. Right ventricular systolic function is normal. The right ventricular  size is normal.   3. Left atrial size was mildly dilated.   4. The mitral valve is normal in structure. Mild mitral valve  regurgitation. No evidence of mitral stenosis.   5. The aortic valve is tricuspid. Aortic valve regurgitation is trivial.  No aortic stenosis is present.   6. The inferior vena cava is normal in size with greater than 50%  respiratory variability, suggesting right atrial pressure of 3 mmHg.   Laboratory Data: High Sensitivity Troponin:   Recent Labs  Lab 08/24/24 1041 08/24/24 1241  TROPONINIHS 5 5     Chemistry Recent Labs  Lab 08/24/24 1041 08/24/24 1241  NA 142  --   K 4.0  --   CL 108  --   CO2 23  --   GLUCOSE 93  --   BUN 11  --   CREATININE 1.15*  --   CALCIUM 9.0  --   MG  --  2.0  GFRNONAA 53*  --   ANIONGAP 11  --     No results for input(s): PROT, ALBUMIN, AST, ALT, ALKPHOS, BILITOT in the last 168 hours. Lipids No results for input(s): CHOL, TRIG, HDL, LABVLDL, LDLCALC, CHOLHDL in the last 168 hours.  Hematology Recent Labs  Lab 08/24/24 1041  WBC 8.3  RBC 3.86*  HGB 12.5  HCT 37.4  MCV  96.9  MCH 32.4  MCHC 33.4  RDW 12.5  PLT 220   Thyroid No results for input(s): TSH, FREET4 in the last 168 hours.  BNP Recent Labs  Lab 08/24/24 1214  BNP 310.1*    DDimer No results for input(s): DDIMER in the last 168 hours.  Radiology/Studies:  CT Angio Chest PE W and/or Wo Contrast Result Date: 08/24/2024 EXAM: CTA CHEST 08/24/2024 01:53:14 PM TECHNIQUE: CTA of the chest was performed with and without the administration of 75 mL of iohexol (OMNIPAQUE) 350 MG/ML injection. Multiplanar reformatted images are provided for review. MIP images are provided for review. Automated exposure control, iterative reconstruction, and/or weight based adjustment of the mA/kV was utilized to reduce the radiation dose to as low as reasonably achievable. COMPARISON: Chest radiograph of earlier today. CLINICAL HISTORY: Pulmonary embolism (PE) suspected, high prob. Chief complaints; Shortness of Breath; Fatigue; Chest Pain. FINDINGS: PULMONARY ARTERIES: The quality of evaluation for pulmonary embolism is excellent. No evidence of pulmonary embolism. Main pulmonary artery is normal in caliber. MEDIASTINUM: Mild cardiomegaly with left atrial enlargement. Reflux of contrast in the hepatic veins can be seen with elevated right heart pressures. Atherosclerosis. There is no acute abnormality of the thoracic aorta. LYMPH NODES: No mediastinal, hilar or axillary lymphadenopathy. LUNGS AND PLEURA: Trace. pleural effusions. Smooth septal thickening  is most apparent at the lung bases. minimal subsegmental atelectasis in the dependent lower lobes. Superior segment of the left lower lobe pulmonary nodule measures 10 x 9 mm on image 48/10 and coronal image 95/6. No pneumothorax. UPPER ABDOMEN: Tiny hiatal hernia. Limited images of the upper abdomen are otherwise unremarkable. SOFT TISSUES AND BONES: Surgical clips in the left breast. No acute bone or soft tissue abnormality. IMPRESSION: 1. No evidence of pulmonary  embolism. 2. Tiny bilateral pleural effusions and smooth septal thickening, most consistent with mild congestive heart failure. 3. 10 mm superior segment of the left lower lobe pulmonary nodule. per consensus criteria, potential clinical strategies include 52-month follow-up CT, PET CT, or biopsy. 4. Aortic atherosclerosis (icd10-i70.0) 5. Tiny hiatal hernia Electronically signed by: Rockey Kilts MD 08/24/2024 03:15 PM EDT RP Workstation: [id number]   DG Chest 2 View Result Date: 08/24/2024 CLINICAL DATA:  Shortness of breath.  Fatigue. EXAM: CHEST - 2 VIEW COMPARISON:  07/25/2024 FINDINGS: Borderline enlarged heart. Mildly tortuous aorta. Mildly prominent pulmonary vasculature. Mild thoracic spine degenerative changes. IMPRESSION: Borderline cardiomegaly and mild pulmonary vascular congestion. Electronically Signed   By: Elspeth Bathe M.D.   On: 08/24/2024 11:20   EP STUDY Result Date: 08/21/2024 See surgical note for result.    Assessment and Plan:  1. Shortness of breath, chest tightness, possible acute HFpEF  2. Recent persistent atrial fibrillation, also here with sinus bradycardia - CTA/BNP c/w mild fluid overload and telemetry shows sinus bradycardia with HR frequently 40s, currently in the 50s - hsTroponins neg x2, no overt angina - patient seen/examined with Dr. Kate - query symptoms related to bradycardia and mild hypervolemia - recommending to reduce Toprol  to 50mg  daily, continue amiodarone  200mg  once daily, and add short course of Lasix 20mg  daily x 3 days then only PRN going forward - no hx spinal stenosis, carpal tunnel - gen cards clinic f/u scheduled 09/06/24 with Cassandra Hails, Cassandra Hicks - keep EP f/u scheduled 09/18/24 with Dr. Inocencio - sleep study in process by primary care    3. Pulmonary nodule, former tobacco use - discussed finding with patient/husband and advised she schedule follow-up with Dr. Burney to discuss next steps - no recent weight loss   4. CKD stage 3a -  Cr appears stable compared to prior baseline, will need to continue to follow outpatient   5. Episodic ETOH use - discussed relationship of ETOH to AF, discussed cutting down   6. Hx rheumatic fever, mild MR - recent echo 06/2024 with mild mitral regurgitation, to follow clinically  We spoke with ED to get her Xarelto  and Lasix doses here prior to DC from ED.  Risk Assessment/Risk Scores:       New York  Heart Association (NYHA) Functional Class NYHA Class III  CHA2DS2-VASc Score = 3   This indicates a 3.2% annual risk of stroke. The patient's score is based upon: CHF History: 0 HTN History: 1 Diabetes History: 0 Stroke History: 0 Vascular Disease History: 0 Age Score: 1 Gender Score: 1     For questions or updates, please contact [facility] Please consult [email or web address] for contact info under      Signed, Dayna N Dunn, Cassandra Hicks  08/24/2024 5:23 PM   Patient seen and examined.  Agree with above documentation.  Ms. Greenstein is a 69 year old female with a history of atrial fibrillation status post cardioversions, rheumatic fever, hypertension who were consulted by Dr. Darra for shortness of breath.  She was diagnosed with atrial fibrillation in 12/2022.  First seen by cardiology 03/2023.  TEE/DCCV was done on 03/28/2023 with successful cardioversion to sinus rhythm.  Echocardiogram 04/2023 showed EF 55 to 60%.  Noted to be back in A-fib 06/2024, she was seen by A-fib clinic and underwent DCCV 07/02/2024.  However at follow-up 07/2024 she was back in A-fib.  She underwent repeat DCCV to sinus rhythm on 08/21/2024.  States that since her cardioversion she has not felt improved like she did with her prior cardioversions.  She has noticed she feels very tired still and has dyspnea with exertion.  Also had shortness of breath when lying down last night.  She called the office with the symptoms and was recommended to go to the ED.  On evaluation in the ED, initial vital signs notable for BP  158/89, pulse 40 to 50s, SpO2 96% on room air.  Labs notable for creatinine 1.15, BNP 310, troponin 5 > 5, hemoglobin 12.5, WBC 8.3, platelets 220.  Chest x-ray with mild pulmonary vascular congestion.  CTPA with no evidence of PE but evidence of mild CHF and a 10 mm lung nodule.  On exam, patient is alert and oriented, regular rate and rhythm, no murmurs, diminished breath sounds, no LE edema or JVD.  For her shortness of breath and fatigue, heart rate predominantly in 40s in ED, suspect this may be contributing.  Will decrease Toprol -XL dose to 50 mg daily.  Mildly volume overloaded on exam, will prescribe Lasix 20 mg daily x 3 days then recommend taking as needed.  Would monitor daily weights and take Lasix if gains more than 3 pounds 1 day or 5 pounds in 1 week.  Will schedule outpatient follow-up.  Lonni LITTIE Nanas, MD

## 2024-08-24 NOTE — ED Notes (Signed)
 Extra SST & Blue top drawn/ Extra urine sent to main lab

## 2024-08-24 NOTE — Telephone Encounter (Signed)
 Pt is not feeling well after having a cardioversion. Please advise.   Pt c/o Shortness Of Breath: STAT if SOB developed within the last 24 hours or pt is noticeably SOB on the phone  1. Are you currently SOB (can you hear that pt is SOB on the phone)? Yes   2. How long have you been experiencing SOB? About 2 days   3. Are you SOB when sitting or when up moving around? Both   4. Are you currently experiencing any other symptoms? Tight chest, low energy, fatigue

## 2024-08-24 NOTE — Telephone Encounter (Signed)
 Spoke with pt who reports since DCCV on 08/21/24 hasn't felt well and its getting worse.  Reports HR 45-55 when checked at local pharmacy.  Has chest tightness and SOB that increases when bends over.  Notes gasping for air when goes to stand back up straight.   Pt reports feels like may pass out at times.  Gasping for air when going up stairs and didn't have this prior to DCCV.  Reports pain with deep breath at times.  Doesn't have a home BP cuff.  Hasn't missed any doses of Xarelto .  Advised pt to go to ED for evaluation. Pt asked if she should take medications prior to going.  Advised pt not to take any medications just go to the ED.  Advised without recent BP and HR readings it's not safe to advise on medication management with current symptoms.  Pt expresses understanding.

## 2024-09-03 ENCOUNTER — Telehealth: Payer: Self-pay | Admitting: Internal Medicine

## 2024-09-03 NOTE — Telephone Encounter (Signed)
 Caller (Joey) in patient's PCP office stated Dr. Burney wants patient to be placed on a Wait List to have cardiac ablation sooner.

## 2024-09-03 NOTE — Telephone Encounter (Signed)
 I spoke with Joey at Dr Elbridge office. She reports patient was seen recently by Dr Burney and reported several symptomatic afib events.  Ablation is planned for January.  Dr Richter's office is asking if this can be done sooner.  Patient has appointment with Dr Inocencio on 11/11

## 2024-09-04 NOTE — Telephone Encounter (Addendum)
  Pt reports that HRs are still averaging 45-55 bpm.  Not symptomatic currently.  Pt will keep monitoring for now and will call back if continues low HRs and symptoms occur.  Otherwise we will see her 11/11 to determine if proceeding with ablation.   Patient verbalized understanding and agreeable to plan.

## 2024-09-05 NOTE — Progress Notes (Unsigned)
 Cardiology Office Note:    Date:  09/06/2024   ID:  Cassandra Hicks, DOB 09-02-1956, MRN [id number]  PCP:  Burney Darice CROME, MD   [facility] Providers Cardiologist:  Lurena MARLA Red, MD Cardiology APP:  Madie Jon Garre, [place]     Referring MD: Burney Darice CROME, MD   Chief Complaint  Patient presents with   Follow-up    Persistent AFib    History of Present Illness:    Cassandra Hicks is a 68 y.o. female with a hx of PAF, hypertension, GERD, and former tobacco abuse.  She was diagnosed with atrial fibrillation with her PCP.  She reported fatigue and shortness of breath.  She was anticoagulated with Xarelto  and presented for TEE guided cardioversion 03/28/2023.  She was successfully converted to sinus rhythm.TEE notable for severe LAE. Echocardiogram 04/28/2023 showed an LVEF of 55-60%, normal RV function, mild MR.  Echocardiogram 06/2024 with preserved LVEF and mild MR, noted to be in atrial fibrillation.  She also reported symptoms of shortness of breath x 2 weeks.  She underwent repeat cardioversion 07/02/2024 with conversion to NSR.  When I saw her back, she was back in Afib with RVR and I sent her to the ER. She was feeling poorly. Unfortunately, she reported to the EDP she missed a dose of eliquis. She was started on cardizem  with improved rates. She was not admitted. I referred her to EP, seen on 08/17/24. EP started PO amiodarone  load. She was seen back in the ER 08/24/24 with SOB and possible HFpEF exacerbation and bradycardia with heart rates in the 40-50s. Toprol  was reduced and amiodarone  continued. PRN lasix recommended.   She presents for post ER follow up. She feels quite fatigued, HR continues to be in the 40-50s. She is taking xarelto  at night but other medications in the morning. I suggested taking toprol  at  night to help with fatigue, but this could be related to bradycardia. She takes lasix infrequently. I asked her to weigh daily.    Past Medical History:   Diagnosis Date   A-fib (HCC)    Abnormal mammogram of left breast 09/17/2016   Anxiety    Breast mass, left    GERD (gastroesophageal reflux disease)    mild, no meds, diet controlled   Heart murmur    as child, no problem   Hypertension    Missed ab    x 2  D&E   SVD (spontaneous vaginal delivery)    x 2    Past Surgical History:  Procedure Laterality Date   ABDOMINAL HYSTERECTOMY N/A 12/09/2014   Procedure: HYSTERECTOMY ABDOMINAL;  Surgeon: Charlie CHRISTELLA Croak, MD;  Location: [facility];  Service: Gynecology;  Laterality: N/A;   APPENDECTOMY     BREAST BIOPSY Left 08/10/2016   BREAST EXCISIONAL BIOPSY Left 09/2016   BREAST LUMPECTOMY WITH RADIOACTIVE SEED LOCALIZATION Left 09/17/2016   Procedure: LEFT BREAST LUMPECTOMY WITH RADIOACTIVE SEED LOCALIZATION;  Surgeon: Elon Pacini, MD;  Location: [facility];  Service: General;  Laterality: Left;   CARDIOVERSION N/A 03/28/2023   Procedure: CARDIOVERSION;  Surgeon: Pietro Redell RAMAN, MD;  Location: [facility];  Service: Cardiovascular;  Laterality: N/A;   CARDIOVERSION N/A 07/02/2024   Procedure: CARDIOVERSION;  Surgeon: Loni Soyla LABOR, MD;  Location: [facility];  Service: Cardiovascular;  Laterality: N/A;   CARDIOVERSION N/A 08/21/2024   Procedure: CARDIOVERSION;  Surgeon: Mona Vinie BROCKS, MD;  Location: [facility];  Service: Cardiovascular;  Laterality: N/A;   COLONOSCOPY     DIAGNOSTIC LAPAROSCOPY     DILATION AND CURETTAGE OF UTERUS     x 2 for MAB   ECTOPIC PREGNANCY SURGERY     x 2   EYE SURGERY     bilateral - Radial Keratotomy    SALPINGOOPHORECTOMY Bilateral 12/09/2014   Procedure: SALPINGO OOPHORECTOMY;  Surgeon: Charlie CHRISTELLA Croak, MD;  Location: [facility];  Service: Gynecology;  Laterality: Bilateral;   TEE WITHOUT CARDIOVERSION N/A 03/28/2023   Procedure: TRANSESOPHAGEAL ECHOCARDIOGRAM;  Surgeon: Pietro Redell RAMAN, MD;  Location: [facility]IVE CV [facility];  Service: Cardiovascular;   Laterality: N/A;   TONSILLECTOMY     TUBAL LIGATION     WISDOM TOOTH EXTRACTION      Current Medications: Current Meds  Medication Sig   acetaminophen  (TYLENOL ) 500 MG tablet Take 1,000 mg by mouth every 6 (six) hours as needed for moderate pain.   cholecalciferol (VITAMIN D3) 25 MCG (1000 UNIT) tablet Take 1,000 Units by mouth daily.   furosemide (LASIX) 20 MG tablet Take 1 tablet (20 mg total) by mouth as directed. Take 1 tablet daily though 08/26/24, then only once daily as needed for weight gain of 3-5lb, worsening shortness of breath, or swelling.   loratadine (CLARITIN) 10 MG tablet Take 10 mg by mouth daily as needed for allergies.   losartan -hydrochlorothiazide  (HYZAAR) 50-12.5 MG tablet Take 0.5 tablets by mouth every morning.   metoprolol  succinate (TOPROL -XL) 50 MG 24 hr tablet Take 1 tablet (50 mg total) by mouth daily. Take with or immediately following a meal.   Prenatal Vit-Fe Fumarate-FA (PRENATAL PO) Take 1 tablet by mouth daily.   rivaroxaban  (XARELTO ) 20 MG TABS tablet Take 1 tablet (20 mg total) by mouth daily with supper.   rosuvastatin (CRESTOR) 10 MG tablet Take 10 mg by mouth daily.   [DISCONTINUED] amiodarone  (PACERONE ) 200 MG tablet Take 1 tablet (200 mg total) by mouth in the morning and at bedtime for 14 days, THEN 1 tablet (200 mg total) daily. (Patient taking differently: Pt takes 1 tablet 200 mg once a day.)   [DISCONTINUED] nitroGLYCERIN (NITROSTAT) 0.4 MG SL tablet Place 1 tablet (0.4 mg total) under the tongue every 5 (five) minutes as needed for chest pain.     Allergies:   Penicillins   Social History   Socioeconomic History   Marital status: Married    Spouse name: Not on file   Number of children: Not on file   Years of education: Not on file   Highest education level: Not on file  Occupational History   Not on file  Tobacco Use   Smoking status: Former    Current packs/day: 0.00    Average packs/day: 1 pack/day for 30.0 years (30.0 ttl  pk-yrs)    Types: Cigarettes    Start date: 12/07/1966    Quit date: 12/07/1996    Years since quitting: 27.7   Smokeless tobacco: Never   Tobacco comments:    Former smoker 06/25/24  Substance and Sexual Activity   Alcohol use: Yes    Alcohol/week: 1.0 standard drink of alcohol    Types: 1 Cans of beer per week    Comment: social/weekends   Drug use: Yes    Types: Marijuana    Comment: every   Sexual activity: Yes    Birth control/protection: Post-menopausal  Other Topics Concern   Not on file  Social History Narrative   Not on file   Social Drivers of Health  Financial Resource Strain: Not on file  Food Insecurity: Not on file  Transportation Needs: Not on file  Physical Activity: Not on file  Stress: Not on file  Social Connections: Not on file     Family History: The patient's family history includes COPD in her mother; Cancer in her father; Hypertension in her brother, father, and sister.  ROS:   Please see the history of present illness.     All other systems reviewed and are negative.  EKGs/Labs/Other Studies Reviewed:    The following studies were reviewed today:       Recent Labs: 08/17/2024: ALT 17; TSH 3.340 08/24/2024: B Natriuretic Peptide 310.1; BUN 11; Creatinine, Ser 1.15; Hemoglobin 12.5; Magnesium 2.0; Platelets 220; Potassium 4.0; Sodium 142  Recent Lipid Panel    Component Value Date/Time   CHOL 199 06/05/2015 0900   TRIG 170 (H) 06/05/2015 0900   HDL 57 06/05/2015 0900   CHOLHDL 3.5 06/05/2015 0900   VLDL 34 (H) 06/05/2015 0900   LDLCALC 108 06/05/2015 0900     Risk Assessment/Calculations:    CHA2DS2-VASc Score = 3   This indicates a 3.2% annual risk of stroke. The patient's score is based upon: CHF History: 0 HTN History: 1 Diabetes History: 0 Stroke History: 0 Vascular Disease History: -- (atherosclerosis on CT) Age Score: 1 Gender Score: 1             Physical Exam:    VS:  BP (!) 128/59   Pulse 77   Ht 5'  1 (1.549 m)   Wt 181 lb 12.8 oz (82.5 kg)   SpO2 96%   BMI 34.35 kg/m     Wt Readings from Last 3 Encounters:  09/06/24 181 lb 12.8 oz (82.5 kg)  08/24/24 175 lb (79.4 kg)  08/21/24 175 lb (79.4 kg)     GEN:  Well nourished, well developed in no acute distress HEENT: Normal NECK: No JVD; No carotid bruits LYMPHATICS: No lymphadenopathy CARDIAC: regular rhythm, bradycardic rate RESPIRATORY:  Clear to auscultation without rales, wheezing or rhonchi  ABDOMEN: Soft, non-tender, non-distended MUSCULOSKELETAL:  No edema; No deformity  SKIN: Warm and dry NEUROLOGIC:  Alert and oriented x 3 PSYCHIATRIC:  Normal affect   ASSESSMENT:    1. Persistent atrial fibrillation (HCC)   2. Chronic anticoagulation   3. Primary hypertension   4. Mitral valve insufficiency, unspecified etiology    PLAN:    In order of problems listed above:  PAF - ?persistent Bradycardia  TEE-DCCV 03/2023 to SR DCCV 07/02/2024 to SR but ERAF Mild LAE  Continue BB, amiodarone  HR has been in the 40-50s - can drop toprol  to 37.5 mg if she remains symptomatic, has been ok most days - we discussed moving toprol  to nighttime dosing, if she remains symptomatic, will drop to 37.5 mg toprol  (will need 25 mg tablets), but risk return of Afib - she will let me know - already has appt with Dr. Inocencio 11/11 for ablation   Chronic anticoagulation Continue xarelto  - no bleeding issues   HTN Losartan -hydrochlorothiazide  50-12.5 mg daily, Toprol  50 mg daily - BP well controlled   CKD 3a - creatinine appears stable   Mild MR - follow echo next year            Medication Adjustments/Labs and Tests Ordered: Current medicines are reviewed at length with the patient today.  Concerns regarding medicines are outlined above.  No orders of the defined types were placed in this encounter.  Meds ordered  this encounter  Medications   DISCONTD: nitroGLYCERIN (NITROSTAT) 0.4 MG SL tablet    Sig: Place 1  tablet (0.4 mg total) under the tongue every 5 (five) minutes as needed for chest pain.    Dispense:  25 tablet    Refill:  3   amiodarone  (PACERONE ) 200 MG tablet    Sig: Take 1 tablet (200 mg total) by mouth daily.    Dispense:  90 tablet    Refill:  3    Patient Instructions  Medication Instructions:  CONTINUE  taking Amiodarone  (Pacerone ) 200 mg, one (1) tablet by mouth daily *If you need a refill on your cardiac medications before your next appointment, please call your pharmacy*  Lab Work: None ordered If you have labs (blood work) drawn today and your tests are completely normal, you will receive your results only by: MyChart Message (if you have MyChart) OR A paper copy in the mail If you have any lab test that is abnormal or we need to change your treatment, we will call you to review the results.  Testing/Procedures: None ordered  Follow-Up: At [facility], you and your health needs are our priority.  As part of our continuing mission to provide you with exceptional heart care, our providers are all part of one team.  This team includes your primary Cardiologist (physician) and Advanced Practice Providers or APPs (Physician Assistants and Nurse Practitioners) who all work together to provide you with the care you need, when you need it.  Your next appointment:   6 month(s)  Provider:   Jon Hails, PA-C          We recommend signing up for the patient portal called MyChart.  Sign up information is provided on this After Visit Summary.  MyChart is used to connect with patients for Virtual Visits (Telemedicine).  Patients are able to view lab/test results, encounter notes, upcoming appointments, etc.  Non-urgent messages can be sent to your provider as well.   To learn more about what you can do with MyChart, go to [email or web address].   Other Instructions We have canceled you AFIB Clinic visit.   Keep your upcoming visit with Dr. Inocencio.            Signed, Jon Nat Hails, PA  09/06/2024 3:31 PM    [facility]

## 2024-09-06 ENCOUNTER — Ambulatory Visit: Attending: Internal Medicine | Admitting: Physician Assistant

## 2024-09-06 ENCOUNTER — Encounter: Payer: Self-pay | Admitting: Physician Assistant

## 2024-09-06 VITALS — BP 128/59 | HR 77 | Ht 61.0 in | Wt 181.8 lb

## 2024-09-06 DIAGNOSIS — I10 Essential (primary) hypertension: Secondary | ICD-10-CM | POA: Diagnosis not present

## 2024-09-06 DIAGNOSIS — Z7901 Long term (current) use of anticoagulants: Secondary | ICD-10-CM

## 2024-09-06 DIAGNOSIS — I4819 Other persistent atrial fibrillation: Secondary | ICD-10-CM

## 2024-09-06 DIAGNOSIS — I340 Nonrheumatic mitral (valve) insufficiency: Secondary | ICD-10-CM

## 2024-09-06 MED ORDER — NITROGLYCERIN 0.4 MG SL SUBL: 0.4000 mg | SUBLINGUAL_TABLET | SUBLINGUAL | 3 refills | Status: DC | PRN

## 2024-09-06 MED ORDER — AMIODARONE HCL 200 MG PO TABS
200.0000 mg | ORAL_TABLET | Freq: Every day | ORAL | 3 refills | Status: AC
Start: 2024-09-06 — End: ?

## 2024-09-06 NOTE — Patient Instructions (Signed)
 Medication Instructions:  CONTINUE  taking Amiodarone  (Pacerone ) 200 mg, one (1) tablet by mouth daily *If you need a refill on your cardiac medications before your next appointment, please call your pharmacy*  Lab Work: None ordered If you have labs (blood work) drawn today and your tests are completely normal, you will receive your results only by: MyChart Message (if you have MyChart) OR A paper copy in the mail If you have any lab test that is abnormal or we need to change your treatment, we will call you to review the results.  Testing/Procedures: None ordered  Follow-Up: At [facility], you and your health needs are our priority.  As part of our continuing mission to provide you with exceptional heart care, our providers are all part of one team.  This team includes your primary Cardiologist (physician) and Advanced Practice Providers or APPs (Physician Assistants and Nurse Practitioners) who all work together to provide you with the care you need, when you need it.  Your next appointment:   6 month(s)  Provider:   Jon Hails, PA-C          We recommend signing up for the patient portal called MyChart.  Sign up information is provided on this After Visit Summary.  MyChart is used to connect with patients for Virtual Visits (Telemedicine).  Patients are able to view lab/test results, encounter notes, upcoming appointments, etc.  Non-urgent messages can be sent to your provider as well.   To learn more about what you can do with MyChart, go to [email or web address].   Other Instructions We have canceled you AFIB Clinic visit.   Keep your upcoming visit with Dr. Inocencio.

## 2024-09-16 ENCOUNTER — Other Ambulatory Visit: Payer: Self-pay | Admitting: Physician Assistant

## 2024-09-17 NOTE — Progress Notes (Unsigned)
  Electrophysiology Office Note:   Date:  09/18/2024  ID:  [name], DOB 1956/02/16, MRN [id number]  Primary Cardiologist: Lurena MARLA Red, MD Primary Heart Failure: None Electrophysiologist: Cassandra Daniello [name], MD      History of Present Illness:   MARCILLE Hicks is a 68 y.o. female with h/o hypertension, atrial fibrillation seen today for routine electrophysiology followup.   Since last being seen in our clinic the patient reports doing well.  She has no chest pain and no shortness of breath.  She is back in normal rhythm.  She does have some fatigue which she attributes to her heart rate being slow.  Otherwise no acute complaints.  She is nervous about her upcoming ablation.  she denies chest pain, palpitations, dyspnea, PND, orthopnea, nausea, vomiting, dizziness, syncope, edema, weight gain, or early satiety.   Review of systems complete and found to be negative unless listed in HPI.   EP Information / Studies Reviewed:    EKG is not ordered today. EKG from 08/17/2024 reviewed which showed atrial fibrillation        Risk Assessment/Calculations:    CHA2DS2-VASc Score = 3   This indicates a 3.2% annual risk of stroke. The patient's score is based upon: CHF History: 0 HTN History: 1 Diabetes History: 0 Stroke History: 0 Vascular Disease History: -- (atherosclerosis on CT) Age Score: 1 Gender Score: 1            Physical Exam:   VS:  BP 138/74   Pulse (!) 49   Ht 5' 1 (1.549 m)   Wt 183 lb 9.6 oz (83.3 kg)   SpO2 99%   BMI 34.69 kg/m    Wt Readings from Last 3 Encounters:  09/18/24 183 lb 9.6 oz (83.3 kg)  09/06/24 181 lb 12.8 oz (82.5 kg)  08/24/24 175 lb (79.4 kg)     GEN: Well nourished, well developed in no acute distress NECK: No JVD; No carotid bruits CARDIAC: Regular rate and rhythm, no murmurs, rubs, gallops RESPIRATORY:  Clear to auscultation without rales, wheezing or rhonchi  ABDOMEN: Soft, non-tender, non-distended EXTREMITIES:  No edema;  No deformity   ASSESSMENT AND PLAN:    1.  Persistent atrial fibrillation: She has had multiple cardioversions, and is now on amiodarone .  Scheduled for ablation 11/13/2024.  She feels somewhat fatigued.  Her heart rate is slow.  Darryll Raju stop her metoprolol  prior to ablation.  Continue amiodarone .  Risk and benefits of ablation were discussed.  She understands the risks and has agreed to the procedure.  Risk, benefits, and alternatives to EP study and radiofrequency/pulse field ablation for afib were also discussed in detail today. These risks include but are not limited to stroke, bleeding, vascular damage, tamponade, perforation, damage to the esophagus, lungs, and other structures, pulmonary vein stenosis, worsening renal function, and death. The patient understands these risk and wishes to proceed.  We Cassandra Hicks therefore proceed with catheter ablation at the next available time.  Carto, ICE, anesthesia are requested for the procedure.  This patient Cassandra Hicks NOT require CT prior to ablation  2.  Secondary hypercoagulable state: On Xarelto   3.  Hypertension: Well-controlled  4.  High-risk medication monitoring: On amiodarone .  TSH and LFTs within normal limits  Follow up with EP Team as usual post procedure  Signed, Leeann Bady [name], MD

## 2024-09-18 ENCOUNTER — Encounter: Payer: Self-pay | Admitting: Cardiology

## 2024-09-18 ENCOUNTER — Ambulatory Visit: Attending: Cardiology | Admitting: Cardiology

## 2024-09-18 VITALS — BP 138/74 | HR 49 | Ht 61.0 in | Wt 183.6 lb

## 2024-09-18 DIAGNOSIS — I10 Essential (primary) hypertension: Secondary | ICD-10-CM

## 2024-09-18 DIAGNOSIS — Z01812 Encounter for preprocedural laboratory examination: Secondary | ICD-10-CM

## 2024-09-18 DIAGNOSIS — Z79899 Other long term (current) drug therapy: Secondary | ICD-10-CM

## 2024-09-18 DIAGNOSIS — I4819 Other persistent atrial fibrillation: Secondary | ICD-10-CM | POA: Diagnosis not present

## 2024-09-18 DIAGNOSIS — D6869 Other thrombophilia: Secondary | ICD-10-CM | POA: Diagnosis not present

## 2024-09-18 NOTE — Patient Instructions (Signed)
 Medication Instructions:  Your physician has recommended you make the following change in your medication:   ** Stop Metoprolol  Succinate 50mg   *If you need a refill on your cardiac medications before your next appointment, please call your pharmacy*  Testing/Procedures: Ablation Your physician has recommended that you have an ablation. Catheter ablation is a medical procedure used to treat some cardiac arrhythmias (irregular heartbeats). During catheter ablation, a long, thin, flexible tube is put into a blood vessel in your groin (upper thigh), or neck. This tube is called an ablation catheter. It is then guided to your heart through the blood vessel. Radio frequency waves destroy small areas of heart tissue where abnormal heartbeats may cause an arrhythmia to start.   You are scheduled for Atrial Fibrillation Ablation on Tuesday, January 6 with Dr. Dr. Inocencio. Please arrive at the Main Entrance A at [facility]: [address] [place], [place] [address] at 5:30 AM   What To Expect: Please have labs drawn the week of 10/22/2024. Labs: you will need to have lab work drawn within 30 days of your procedure. Please go to any [facility] location to have these drawn - no appointment is needed. You will receive procedure instructions either through MyChart or in the mail 4-6 week prior to your procedure.  After your procedure we recommend no driving for 4 days, no lifting over 5 lbs for 7 days, and no work or strenuous activity for 7 days.  Please contact our office at [phone] if you have any questions.    Follow-Up: We will contact you to schedule your post-procedure appointments.

## 2024-09-24 ENCOUNTER — Ambulatory Visit (HOSPITAL_COMMUNITY): Admitting: Physician Assistant

## 2024-09-28 ENCOUNTER — Ambulatory Visit: Admitting: Cardiology

## 2024-09-30 ENCOUNTER — Encounter: Payer: Self-pay | Admitting: Cardiology

## 2024-10-02 ENCOUNTER — Encounter: Payer: Self-pay | Admitting: *Deleted

## 2024-10-10 ENCOUNTER — Telehealth: Payer: Self-pay

## 2024-10-10 NOTE — Telephone Encounter (Signed)
-----   Message from Nurse Aldona R sent at 09/20/2024  5:54 PM EST ----- Regarding: 01/06 PVI Important: list procedure date as first item in subject line, followed by procedure type (e.g., 07/20/24 AFib ablation)  Precert:  MD: Camnitz Type of ablation: A-fib Diagnosis: Afib CPT code: A-fib (06343) Ablation scheduled (date/time): 11/13/24  Procedure:  Added to calendar? Yes Orders entered? Yes Letter complete? Yes Scheduled with cath lab? Yes Any medications to hold? Routine Labs ordered (CBC, BMET, PT/INR if on warfarin): Yes Mapping system: CARTO (lab 4 or 6) CARTO/OPAL rep notified? No Cardiac CT needed? No Dye allergy? No Pre-meds ordered and instructions given? N/A Letter method: MyChart H&P: 11/11 Device: No  Follow-up:  Cassie/Angel, please schedule Routine.  Covering RN - please send this message to [employer], EP scheduler, EP Scheduling pool, EP [employer], and CT scheduler (Brittany Lynch/Stephanie Mogg), if indicated.

## 2024-10-23 ENCOUNTER — Telehealth (HOSPITAL_COMMUNITY): Payer: Self-pay

## 2024-10-23 ENCOUNTER — Encounter (HOSPITAL_COMMUNITY): Payer: Self-pay

## 2024-10-23 NOTE — Telephone Encounter (Signed)
 Spoke with patient to complete pre-procedure call.     Health status review:  Any new medical conditions, recent signs of acute illness or been started on antibiotics? No Any recent hospitalizations or surgeries? No Any new medications started since pre-op visit? No  Follow all medication instructions prior to procedure or the procedure may be rescheduled:    Continue taking Xarelto  (Rivaroxaban ) once daily without missing any doses before procedure. Essential chronic medications:  No medication should be continued, unless told otherwise. On the morning of your procedure DO NOT take any medication., including Xarelto  (Rivaroxaban ).  Nothing to eat or drink after midnight prior to your procedure.  Pre-procedure testing scheduled: CT not required and lab work by December 23.  Confirmed patient is scheduled for Atrial Fibrillation Ablation  on Tuesday, January 6 with Dr. Inocencio. Instructed patient to arrive at the Main Entrance A at [facility]: [address] [place], [place] [address] and check in at Admitting at 5:30 AM.  Plan to go home the same day, you will only stay overnight if medically necessary. You MUST have a responsible adult to drive you home and MUST be with you the first 24 hours after you arrive home or your procedure could be cancelled.  Informed a nurse may call a day before the procedure to confirm arrival time and ensure instructions are followed.  Patient verbalized understanding to information provided and is agreeable to proceed with procedure.   Advised to contact RN Navigator at [phone], to inform of any new medications started after call or concerns prior to procedure.

## 2024-10-27 LAB — BASIC METABOLIC PANEL WITH GFR
BUN/Creatinine Ratio: 11 — AB (ref 12–28)
BUN: 14 mg/dL (ref 8–27)
CO2: 25 mmol/L (ref 20–29)
Calcium: 9.6 mg/dL (ref 8.7–10.3)
Chloride: 101 mmol/L (ref 96–106)
Creatinine, Ser: 1.27 mg/dL — AB (ref 0.57–1.00)
Glucose: 121 mg/dL — AB (ref 70–99)
Potassium: 3.6 mmol/L (ref 3.5–5.2)
Sodium: 141 mmol/L (ref 134–144)
eGFR: 46 mL/min/1.73 — AB

## 2024-10-27 LAB — CBC
Hematocrit: 41.1 % (ref 34.0–46.6)
Hemoglobin: 13.5 g/dL (ref 11.1–15.9)
MCH: 31.8 pg (ref 26.6–33.0)
MCHC: 32.8 g/dL (ref 31.5–35.7)
MCV: 97 fL (ref 79–97)
Platelets: 235 x10E3/uL (ref 150–450)
RBC: 4.24 x10E6/uL (ref 3.77–5.28)
RDW: 12.6 % (ref 11.7–15.4)
WBC: 6.8 x10E3/uL (ref 3.4–10.8)
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
## 2024-11-11 NOTE — Discharge Instructions (Signed)
"  Continue your inhalers as needed.  Pick up the steroids that are waiting for you at the pharmacy.  Follow-up with your primary care doctor.    You have a virus and symptoms must run its course.  Symptoms typically last 7 days or so.  "

## 2024-11-11 NOTE — ED Triage Notes (Signed)
"  Pt coming from home for flu-like symptoms x 3 days. Endorses fever, cough, generalized  fatigue    Hx COPD, CHF  "

## 2024-11-12 ENCOUNTER — Inpatient Hospital Stay
Admit: 2024-11-12 | Discharge: 2024-11-12 | Disposition: A | Discharge status: Home / Self Care | Payer: MEDICARE | Arrived: AM

## 2024-11-12 LAB — COVID-19 & INFLUENZA COMBO
Influenza A, NAA: NOT DETECTED
Influenza B, NAA: NOT DETECTED
SARS-CoV-2, Rapid: NOT DETECTED

## 2024-11-12 MED ORDER — PREDNISONE 50 MG PO TABS
50 | Freq: Once | ORAL | Status: AC
Start: 2024-11-12 — End: 2024-11-11
  Administered 2024-11-12: 02:00:00 60 mg via ORAL

## 2024-11-12 MED ORDER — ACETAMINOPHEN 500 MG PO TABS
500 | ORAL | Status: AC
Start: 2024-11-12 — End: 2024-11-11
  Administered 2024-11-12: 02:00:00 1000 mg via ORAL

## 2024-11-12 MED FILL — PREDNISONE 10 MG PO TABS: 10 mg | ORAL | Qty: 1 | Fill #0

## 2024-11-12 MED FILL — ACETAMINOPHEN EXTRA STRENGTH 500 MG PO TABS: 500 mg | ORAL | Qty: 2 | Fill #0

## 2024-11-12 NOTE — Pre-Procedure Instructions (Signed)
 Attempted to call patient regarding procedure instructions.  Left voicemail on  following items: Arrival time 0515 Nothing to eat or drink after midnight No meds AM of procedure Responsible person to drive you home and stay with you for 24 hrs  Have you missed any doses of anti-coagulant Xarelto -should be taken once a day, if you have missed any doses please let us  know.

## 2024-11-13 ENCOUNTER — Ambulatory Visit (HOSPITAL_COMMUNITY): Admitting: Certified Registered"

## 2024-11-13 ENCOUNTER — Ambulatory Visit (HOSPITAL_COMMUNITY)
Admission: RE | Disposition: A | Discharge status: Home / Self Care | Payer: Self-pay | Source: Home / Self Care | Attending: Cardiology

## 2024-11-13 ENCOUNTER — Other Ambulatory Visit: Payer: Self-pay

## 2024-11-13 ENCOUNTER — Ambulatory Visit (HOSPITAL_COMMUNITY)
Admission: RE | Admit: 2024-11-13 | Discharge: 2024-11-13 | Disposition: A | Attending: Cardiology | Admitting: Cardiology

## 2024-11-13 DIAGNOSIS — I4891 Unspecified atrial fibrillation: Secondary | ICD-10-CM

## 2024-11-13 DIAGNOSIS — K219 Gastro-esophageal reflux disease without esophagitis: Secondary | ICD-10-CM | POA: Diagnosis not present

## 2024-11-13 DIAGNOSIS — I10 Essential (primary) hypertension: Secondary | ICD-10-CM | POA: Insufficient documentation

## 2024-11-13 DIAGNOSIS — I4819 Other persistent atrial fibrillation: Secondary | ICD-10-CM | POA: Diagnosis not present

## 2024-11-13 DIAGNOSIS — Z87891 Personal history of nicotine dependence: Secondary | ICD-10-CM | POA: Insufficient documentation

## 2024-11-13 DIAGNOSIS — F419 Anxiety disorder, unspecified: Secondary | ICD-10-CM | POA: Diagnosis not present

## 2024-11-13 HISTORY — PX: ATRIAL FIBRILLATION ABLATION: EP1191

## 2024-11-13 LAB — POCT ACTIVATED CLOTTING TIME: Activated Clotting Time: 378 s

## 2024-11-13 MED ORDER — SODIUM CHLORIDE 0.9 % IV SOLN: INTRAVENOUS | Status: DC

## 2024-11-13 MED ORDER — ATROPINE SULFATE 1 MG/10ML IJ SOSY
PREFILLED_SYRINGE | INTRAMUSCULAR | Status: DC | PRN
  Administered 2024-11-13: 1 mg via INTRAVENOUS

## 2024-11-13 MED ORDER — PROTAMINE SULFATE 10 MG/ML IV SOLN
INTRAVENOUS | Status: DC | PRN
  Administered 2024-11-13: 10 mg via INTRAVENOUS

## 2024-11-13 MED ORDER — LIDOCAINE 2% (20 MG/ML) 5 ML SYRINGE
INTRAMUSCULAR | Status: DC | PRN
  Administered 2024-11-13: 80 mg via INTRAVENOUS

## 2024-11-13 MED ORDER — GLYCOPYRROLATE PF 0.2 MG/ML IJ SOSY
PREFILLED_SYRINGE | INTRAMUSCULAR | Status: DC | PRN
  Administered 2024-11-13: .2 mg via INTRAVENOUS

## 2024-11-13 MED ORDER — SODIUM CHLORIDE 0.9% FLUSH: 3.0000 mL | Freq: Two times a day (BID) | INTRAVENOUS | Status: DC

## 2024-11-13 MED ORDER — ONDANSETRON HCL 4 MG/2ML IJ SOLN: 4.0000 mg | Freq: Four times a day (QID) | INTRAMUSCULAR | Status: DC | PRN

## 2024-11-13 MED ORDER — PHENYLEPHRINE HCL-NACL 20-0.9 MG/250ML-% IV SOLN
INTRAVENOUS | Status: DC | PRN
  Administered 2024-11-13: 40 ug/min via INTRAVENOUS

## 2024-11-13 MED ORDER — ONDANSETRON HCL 4 MG/2ML IJ SOLN
INTRAMUSCULAR | Status: DC | PRN
  Administered 2024-11-13: 4 mg via INTRAVENOUS

## 2024-11-13 MED ORDER — SODIUM CHLORIDE 0.9 % IV SOLN: 250.0000 mL | INTRAVENOUS | Status: DC | PRN

## 2024-11-13 MED ORDER — SUGAMMADEX SODIUM 200 MG/2ML IV SOLN
INTRAVENOUS | Status: DC | PRN
  Administered 2024-11-13: 160 mg via INTRAVENOUS

## 2024-11-13 MED ORDER — PROPOFOL 500 MG/50ML IV EMUL
INTRAVENOUS | Status: DC | PRN
  Administered 2024-11-13: 85 ug/kg/min via INTRAVENOUS

## 2024-11-13 MED ORDER — ATROPINE SULFATE 1 MG/10ML IJ SOSY
PREFILLED_SYRINGE | INTRAMUSCULAR | Status: AC
  Filled 2024-11-13: qty 30

## 2024-11-13 MED ORDER — FENTANYL CITRATE (PF) 100 MCG/2ML IJ SOLN
INTRAMUSCULAR | Status: AC
  Filled 2024-11-13: qty 2

## 2024-11-13 MED ORDER — HEPARIN SODIUM (PORCINE) 1000 UNIT/ML IJ SOLN
INTRAMUSCULAR | Status: DC | PRN
  Administered 2024-11-13: 14000 [IU] via INTRAVENOUS

## 2024-11-13 MED ORDER — SODIUM CHLORIDE 0.9% FLUSH: 3.0000 mL | INTRAVENOUS | Status: DC | PRN

## 2024-11-13 MED ORDER — ROCURONIUM BROMIDE 10 MG/ML (PF) SYRINGE
PREFILLED_SYRINGE | INTRAVENOUS | Status: DC | PRN
  Administered 2024-11-13: 80 mg via INTRAVENOUS

## 2024-11-13 MED ORDER — PROPOFOL 10 MG/ML IV BOLUS
INTRAVENOUS | Status: DC | PRN
  Administered 2024-11-13: 120 mg via INTRAVENOUS

## 2024-11-13 MED ORDER — FENTANYL CITRATE (PF) 250 MCG/5ML IJ SOLN
INTRAMUSCULAR | Status: DC | PRN
  Administered 2024-11-13: 100 ug via INTRAVENOUS

## 2024-11-13 MED ORDER — HEPARIN (PORCINE) IN NACL 1000-0.9 UT/500ML-% IV SOLN
INTRAVENOUS | Status: DC | PRN
  Administered 2024-11-13 (×2): 500 mL

## 2024-11-13 MED ORDER — ACETAMINOPHEN 325 MG PO TABS
650.0000 mg | ORAL_TABLET | ORAL | Status: DC | PRN
  Administered 2024-11-13: 650 mg via ORAL
  Filled 2024-11-13: qty 2

## 2024-11-13 MED ORDER — DEXAMETHASONE SOD PHOSPHATE PF 10 MG/ML IJ SOLN
INTRAMUSCULAR | Status: DC | PRN
  Administered 2024-11-13: 4 mg via INTRAVENOUS

## 2024-11-13 NOTE — Progress Notes (Signed)
 Patient transferred to short stay.  All groin check performed protocol and bilat. Groin sites level 0 on departure.  Patient was in NSR and on RA, alert and oriented X4.  Patient provided teaching on groin management.

## 2024-11-13 NOTE — Anesthesia Preprocedure Evaluation (Addendum)
 "                                  Anesthesia Evaluation  Patient identified by MRN, date of birth, ID band Patient awake    Reviewed: Allergy & Precautions, NPO status , Patient's Chart, lab work & pertinent test results  Airway Mallampati: II  TM Distance: >3 FB Neck ROM: Full    Dental no notable dental hx.    Pulmonary former smoker   Pulmonary exam normal        Cardiovascular hypertension,  Rhythm:Irregular Rate:Normal  ECHO: IMPRESSIONS   1. Left ventricular ejection fraction, by estimation, is 55 to 60%. The  left ventricle has normal function. The left ventricle has no regional  wall motion abnormalities. Left ventricular diastolic function could not  be evaluated.   2. Right ventricular systolic function is normal. The right ventricular  size is normal.   3. Left atrial size was mildly dilated.   4. The mitral valve is normal in structure. Mild mitral valve  regurgitation. No evidence of mitral stenosis.   5. The aortic valve is tricuspid. Aortic valve regurgitation is trivial.  No aortic stenosis is present.   6. The inferior vena cava is normal in size with greater than 50%  respiratory variability, suggesting right atrial pressure of 3 mmHg.     Neuro/Psych   Anxiety     negative neurological ROS     GI/Hepatic Neg liver ROS,GERD  ,,  Endo/Other  negative endocrine ROS    Renal/GU negative Renal ROS  negative genitourinary   Musculoskeletal negative musculoskeletal ROS (+)    Abdominal Normal abdominal exam  (+)   Peds  Hematology Lab Results      Component                Value               Date                      WBC                      6.8                 10/26/2024                HGB                      13.5                10/26/2024                HCT                      41.1                10/26/2024                MCV                      97                  10/26/2024                PLT  235                  10/26/2024             Lab Results      Component                Value               Date                      NA                       141                 10/26/2024                K                        3.6                 10/26/2024                CO2                      25                  10/26/2024                GLUCOSE                  121 (H)             10/26/2024                BUN                      14                  10/26/2024                CREATININE               1.27 (H)            10/26/2024                CALCIUM                  9.6                 10/26/2024                EGFR                     46 (L)              10/26/2024                GFRNONAA                 53 (L)              08/24/2024              Anesthesia Other Findings   Reproductive/Obstetrics                              Anesthesia Physical Anesthesia Plan  ASA: 3  Anesthesia Plan: General   Post-op Pain Management:    Induction: Intravenous  PONV Risk Score and Plan: 3 and Ondansetron , Dexamethasone , Midazolam  and Treatment may vary due to age or medical condition  Airway Management Planned: Mask and Oral ETT  Additional Equipment: None  Intra-op Plan:   Post-operative Plan: Extubation in OR  Informed Consent: I have reviewed the patients History and Physical, chart, labs and discussed the procedure including the risks, benefits and alternatives for the proposed anesthesia with the patient or authorized representative who has indicated his/her understanding and acceptance.     Dental advisory given  Plan Discussed with: CRNA  Anesthesia Plan Comments:          Anesthesia Quick Evaluation  "

## 2024-11-13 NOTE — Progress Notes (Signed)
 Nurse to the bedside to assist with walking the patient, no complaint of dizziness or visual changes.  No bleeding noted at the insertion sites; gauze clean dry and intact. Patient to the bathroom for void - positive void. Tolerated crackers and water without emesis. Spouse at the bedside to carry patient home via private vehicle. Discharge instructions given earlier. Dr. Inocencio to the bedside after procedure. Patient ready for discharge to home.

## 2024-11-13 NOTE — Progress Notes (Signed)
 HOB has been raised in increments according to orders.  Patient stable with no complaints of dizziness, lightheadedness, nor visual changes. Tolerating clear liquids without emesis, decline regular diet at this time.

## 2024-11-13 NOTE — H&P (Signed)
" °  Electrophysiology Office Note:   Date:  11/13/2024  ID:  Cassandra Hicks, DOB Aug 02, 1956, MRN [id number]  Primary Cardiologist: Lurena MARLA Red, MD Primary Heart Failure: None Electrophysiologist: Jurgen Groeneveld [name], MD      History of Present Illness:   Cassandra Hicks is a 69 y.o. female with h/o hypertension, atrial fibrillation seen today for routine electrophysiology followup.   Today, denies symptoms of palpitations, chest pain, dyspnea, orthopnea, PND, lower extremity edema, claudication, dizziness, presyncope, syncope, bleeding, or neurologic sequela. The patient is tolerating medications without difficulties. Plan ablation today.   EKG is not ordered today. EKG from 08/17/2024 reviewed which showed atrial fibrillation        Risk Assessment/Calculations:    CHA2DS2-VASc Score = 3   This indicates a 3.2% annual risk of stroke. The patient's score is based upon: CHF History: 0 HTN History: 1 Diabetes History: 0 Stroke History: 0 Vascular Disease History: -- (atherosclerosis on CT) Age Score: 1 Gender Score: 1            Physical Exam:   VS:  BP (!) 160/75   Pulse 62   Temp (!) 97.5 F (36.4 C)   Resp 16   Ht 5' 1 (1.549 m)   Wt 79.4 kg   SpO2 100%   BMI 33.07 kg/m    Wt Readings from Last 3 Encounters:  11/13/24 79.4 kg  09/18/24 83.3 kg  09/06/24 82.5 kg    GEN: Well nourished, well developed in no acute distress NECK: No JVD; No carotid bruits CARDIAC: Regular rate and rhythm, no murmurs, rubs, gallops RESPIRATORY:  Clear to auscultation without rales, wheezing or rhonchi  ABDOMEN: Soft, non-tender, non-distended EXTREMITIES:  No edema; No deformity    ASSESSMENT AND PLAN:    1.  Persistent atrial fibrillation: Cassandra Hicks has presented today for surgery, with the diagnosis of AF.  The various methods of treatment have been discussed with the patient and family. After consideration of risks, benefits and other options for treatment, the patient has  consented to  Procedure(s): Catheter ablation as a surgical intervention .  Risks include but not limited to complete heart block, stroke, esophageal damage, nerve damage, bleeding, vascular damage, tamponade, perforation, MI, and death. The patient's history has been reviewed, patient examined, no change in status, stable for surgery.  I have reviewed the patient's chart and labs.  Questions were answered to the patient's satisfaction.    Mylo Choi Norton, MD 11/13/2024 7:11 AM  "

## 2024-11-13 NOTE — Anesthesia Postprocedure Evaluation (Signed)
"   Anesthesia Post Note  Patient: Cassandra Hicks  Procedure(s) Performed: ATRIAL FIBRILLATION ABLATION     Patient location during evaluation: PACU Anesthesia Type: General Level of consciousness: awake and alert Pain management: pain level controlled Vital Signs Assessment: post-procedure vital signs reviewed and stable Respiratory status: spontaneous breathing, nonlabored ventilation, respiratory function stable and patient connected to nasal cannula oxygen Cardiovascular status: blood pressure returned to baseline and stable Postop Assessment: no apparent nausea or vomiting Anesthetic complications: no   There were no known notable events for this encounter.  Last Vitals:  Vitals:   11/13/24 1101 11/13/24 1200  BP: 130/72 122/74  Pulse: 62 (!) 53  Resp: 16 15  Temp:    SpO2: 96% 94%    Last Pain:  Vitals:   11/13/24 1036  TempSrc:   PainSc: 6                  Sherhonda Gaspar P Shilo Pauwels      "

## 2024-11-13 NOTE — Transfer of Care (Signed)
 Immediate Anesthesia Transfer of Care Note  Patient: Cassandra Hicks  Procedure(s) Performed: ATRIAL FIBRILLATION ABLATION  Patient Location: PACU  Anesthesia Type:General  Level of Consciousness: awake, patient cooperative, and responds to stimulation  Airway & Oxygen Therapy: Patient Spontanous Breathing and Patient connected to nasal cannula oxygen  Post-op Assessment: Report given to RN and Post -op Vital signs reviewed and stable  Post vital signs: Reviewed and stable  Last Vitals:  Vitals Value Taken Time  BP 139/80 11/13/24 09:15  Temp 36.4 C 11/13/24 08:48  Pulse 67 11/13/24 09:18  Resp 18 11/13/24 09:18  SpO2 94 % 11/13/24 09:18  Vitals shown include unfiled device data.  Last Pain:  Vitals:   11/13/24 0849  TempSrc:   PainSc: 0-No pain         Complications: There were no known notable events for this encounter.

## 2024-11-13 NOTE — Anesthesia Procedure Notes (Signed)
 Procedure Name: Intubation Date/Time: 11/13/2024 7:50 AM  Performed by: Myrna Homer, CRNAPre-anesthesia Checklist: Patient identified, Emergency Drugs available, Suction available and Patient being monitored Patient Re-evaluated:Patient Re-evaluated prior to induction Oxygen Delivery Method: Circle System Utilized Preoxygenation: Pre-oxygenation with 100% oxygen Induction Type: IV induction Ventilation: Mask ventilation without difficulty Laryngoscope Size: Glidescope and 3 Grade View: Grade I Tube type: Oral Tube size: 7.0 mm Number of attempts: 1 Airway Equipment and Method: Stylet and Oral airway Placement Confirmation: ETT inserted through vocal cords under direct vision, positive ETCO2 and breath sounds checked- equal and bilateral Secured at: 21 cm Tube secured with: Tape Dental Injury: Teeth and Oropharynx as per pre-operative assessment

## 2024-11-14 ENCOUNTER — Telehealth (HOSPITAL_COMMUNITY): Payer: Self-pay

## 2024-11-14 ENCOUNTER — Ambulatory Visit: Admitting: Emergency Medicine

## 2024-11-14 ENCOUNTER — Encounter (HOSPITAL_COMMUNITY): Payer: Self-pay | Admitting: Cardiology

## 2024-11-14 VITALS — BP 132/72 | HR 56 | Temp 98.4°F | Ht 61.0 in | Wt 185.0 lb

## 2024-11-14 DIAGNOSIS — R911 Solitary pulmonary nodule: Secondary | ICD-10-CM

## 2024-11-14 DIAGNOSIS — Z87891 Personal history of nicotine dependence: Secondary | ICD-10-CM | POA: Diagnosis not present

## 2024-11-14 DIAGNOSIS — J90 Pleural effusion, not elsewhere classified: Secondary | ICD-10-CM | POA: Diagnosis not present

## 2024-11-14 MED FILL — Fentanyl Citrate Preservative Free (PF) Inj 100 MCG/2ML: INTRAMUSCULAR | Qty: 2 | Status: AC

## 2024-11-14 NOTE — Patient Instructions (Addendum)
" °  VISIT SUMMARY: Today, you were seen for a follow-up on a pulmonary nodule found in your left lung. We discussed your history of atrial fibrillation and recent procedures, as well as your concerns about the nodule given your family history of lung cancer and past smoking. You are currently active but have some limitations due to your heart condition and a recent foot issue.  YOUR PLAN: -PULMONARY NODULE: A pulmonary nodule is a small growth in the lung that can be benign or malignant. Given your family history and smoking history, we need to evaluate it further. We have ordered a chest CT scan to see if there are any changes in the nodule. Depending on the results, we may consider a bronchoscopy to take a closer look and possibly biopsy the nodule if it appears suspicious.  -SMALL BILATERAL PLEURAL EFFUSIONS: Pleural effusions are a buildup of fluid around the lungs, which can be related to your atrial fibrillation. We will check the status of these effusions on your follow-up CT scan to see if they have resolved with better control of your heart rhythm.  -HISTORY OF TOBACCO USE: There is some risk for tobacco related lung disease or COPD.  We will perform pulmonary function testing going forward to assess her lung function and see how much COPD is present  INSTRUCTIONS: Please schedule a chest CT scan as soon as possible. We will review the results and discuss the next steps during your follow-up appointment. If the nodule appears suspicious, we may need to perform a bronchoscopy for further evaluation.        "

## 2024-11-14 NOTE — Progress Notes (Signed)
 "  Subjective:    Patient ID: Cassandra Hicks, female    DOB: 09/06/1956, 69 y.o.   MRN: [id number]  HPI Discussed the use of AI scribe software for clinical note transcription with the patient, who gave verbal consent to proceed.  History of Present Illness Cassandra Hicks is a 69 year old female with hypertension and atrial fibrillation who presents for evaluation of a pulmonary nodule.  She has a history of atrial fibrillation, which worsened in September, necessitating a cardioversion. Recently, she underwent an ablation procedure and is still in the recovery phase. She describes feeling 'miserable' during episodes of atrial fibrillation, which have significantly limited her activity levels.  In October, she visited the emergency department due to dyspnea. A CT-PA revealed a 10 mm superior segmental left lower lobe pulmonary nodule and small bilateral effusions. She has a 30 pack-year smoking history. Both her parents had lung cancer, raising her concern about the nodule.  She remains generally active and continues to work, although her activity is restricted by atrial fibrillation and a recent foot issue. She has not used inhalers recently, finding them ineffective in the past. 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  No current symptoms of coughing or noisy breathing, though she was congested in September. She is not on any inhaler medications.   CT scan of the chest reviewed and interpreted by me Results Radiology CT chest with pulmonary angiography (08/24/2024): 10 mm superior segmental left lower lobe pulmonary nodule, small bilateral pleural effusions, no pulmonary embolism    Review of Systems As per HPI  Past Medical History:  Diagnosis Date   A-fib (HCC)    Abnormal mammogram of left breast 09/17/2016   Anxiety    Breast mass, left    GERD (gastroesophageal reflux disease)    mild, no meds, diet controlled    Heart murmur    as child, no problem   Hypertension    Missed ab    x 2  D&E   SVD (spontaneous vaginal delivery)    x 2    Family History  Problem Relation Age of Onset   COPD Mother    Cancer Father        lung   Hypertension Father    Hypertension Sister    Hypertension Brother      Social History   Socioeconomic History   Marital status: Married    Spouse name: Not on file   Number of children: Not on file   Years of education: Not on file   Highest education level: Not on file  Occupational History   Not on file  Tobacco Use   Smoking status: Former    Current packs/day: 0.00    Average packs/day: 1 pack/day for 30.0 years (30.0 ttl pk-yrs)    Types: Cigarettes    Start date: 12/07/1966    Quit date: 12/07/1996    Years since quitting: 27.9   Smokeless tobacco: Never   Tobacco comments:    Former smoker 06/25/24  Substance and Sexual Activity   Alcohol use: Yes    Alcohol/week: 1.0 standard drink of alcohol    Types: 1 Cans of beer per week    Comment: social/weekends   Drug use: Yes    Types: Marijuana    Comment: every   Sexual activity: Yes    Birth control/protection: Post-menopausal  Other Topics Concern   Not  on file  Social History Narrative   Not on file   Social Drivers of Health   Tobacco Use: Medium Risk (11/14/2024)   Patient History    Smoking Tobacco Use: Former    Smokeless Tobacco Use: Never    Passive Exposure: Not on Actuary Strain: Not on file  Food Insecurity: Not on file  Transportation Needs: Not on file  Physical Activity: Not on file  Stress: Not on file  Social Connections: Not on file  Intimate Partner Violence: Not on file  Depression (PHQ2-9): Not on file  Alcohol Screen: Not on file  Housing: Not on file  Utilities: Not on file  Health Literacy: Not on file    Allergies[1]  Medications Ordered Prior to Encounter[2]     Objective:    Vitals:   11/14/24 1329  BP: 132/72  Pulse:  (!) 56  Temp: 98.4 F (36.9 C)  SpO2: 100%  Weight: 185 lb (83.9 kg)  Height: 5' 1 (1.549 m)   Physical Exam Gen: Pleasant, overweight woman, in no distress,  normal affect  ENT: No lesions,  mouth clear,  oropharynx clear, no postnasal drip  Neck: No JVD, no stridor  Lungs: No use of accessory muscles, no crackles or wheezing on normal respiration, no wheeze on forced expiration  Cardiovascular: Regular, borderline bradycardia, heart sounds normal, no murmur or gallops, trace pretibial peripheral edema  Musculoskeletal: No deformities, no cyanosis or clubbing  Neuro: alert, awake, non focal  Skin: Warm, no lesions or rashes       Assessment & Plan:   Assessment & Plan Pulmonary nodule   Assessment and Plan Assessment & Plan Pulmonary nodule 10 mm left lower lobe nodule on CT. Differential includes benign and malignant causes.  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  Family history and smoking history increase malignancy risk. Further evaluation needed. - Ordered chest CT to assess nodule changes. - Schedule follow-up to review CT and discuss management. - Consider bronchoscopy for biopsy if nodule suspicious on follow-up.  Small bilateral pleural effusions Noted on CT, likely related to atrial fibrillation. Possible resolution with improved rhythm control. - Evaluate effusions on follow-up CT for resolution.   No follow-ups on file.  I personally spent a total of 60 minutes in the care of the patient today including preparing to see the patient, getting/reviewing separately obtained history, performing a medically appropriate exam/evaluation, counseling and educating, placing orders, documenting clinical information in the EHR, independently interpreting results, and communicating results.   Lamar Chris, MD, PhD 11/14/2024, 2:08 PM [facility] [phone] or if no answer before 7:00PM call  [phone] For any issues after 7:00PM please call eLink [phone]     [1]  Allergies Allergen Reactions   Penicillins Hives  [2]  Current Outpatient Medications on File Prior to Visit  Medication Sig Dispense Refill   acetaminophen  (TYLENOL ) 500 MG tablet Take 1,000 mg by mouth every 6 (six) hours as needed for moderate pain.     amiodarone  (PACERONE ) 200 MG tablet Take 1 tablet (200 mg total) by mouth daily. 90 tablet 3   atorvastatin (LIPITOR) 10 MG tablet Take 10 mg by mouth daily.     furosemide  (LASIX ) 20 MG tablet TAKE 1 TABLET (20 MG TOTAL) BY MOUTH AS DIRECTED. TAKE 1 TABLET DAILY THOUGH 08/26/24, THEN ONLY ONCE DAILY AS NEEDED FOR WEIGHT GAIN OF 3-5LB, WORSENING SHORTNESS OF BREATH, OR SWELLING. 180 tablet 3  losartan -hydrochlorothiazide  (HYZAAR) 50-12.5 MG tablet Take 0.5 tablets by mouth every morning. (Patient taking differently: Take 1 tablet by mouth daily.)     nitroGLYCERIN  (NITROSTAT ) 0.4 MG SL tablet Place 0.4 mg under the tongue every 5 (five) minutes as needed for chest pain.     Prenatal Vit-Fe Fumarate-FA (PRENATAL PO) Take 1 tablet by mouth daily.     rivaroxaban  (XARELTO ) 20 MG TABS tablet Take 1 tablet (20 mg total) by mouth daily with supper. 30 tablet 5   cholecalciferol (VITAMIN D3) 25 MCG (1000 UNIT) tablet Take 2,000 Units by mouth daily. (Patient not taking: Reported on 11/14/2024)     No current facility-administered medications on file prior to visit.   "

## 2024-11-14 NOTE — Telephone Encounter (Signed)
 Spoke with patient to complete post procedure follow up call.  Patient reports no complications with groin sites.   Instructions reviewed with patient:  Remove large bandage at puncture site after 24 hours. It is normal to have bruising, tenderness, mild swelling, and a pea or marble sized lump/knot at the groin site which can take up to three months to resolve.  Get help right away if you notice sudden swelling at the puncture site.  Check your puncture site every day for signs of infection: fever, redness, swelling, pus drainage, warmth, foul odor or excessive pain. If this occurs, please call [phone], to speak with the RN Navigator. Get help right away if your puncture site is bleeding and the bleeding does not stop after applying firm pressure to the area.  You may continue to have skipped beats/ atrial fibrillation during the first several months after your procedure.  It is very important not to miss any doses of your blood thinner Xarelto .    You will follow up with the Afib clinic 4 weeks after your procedure and follow up with the Afib clinic 3 months after your procedure.  Activity restrictions reviewed.  Patient verbalized understanding to all instructions provided.

## 2024-11-22 ENCOUNTER — Ambulatory Visit
Admission: RE | Admit: 2024-11-22 | Discharge: 2024-11-22 | Disposition: A | Source: Ambulatory Visit | Attending: Emergency Medicine | Admitting: Emergency Medicine

## 2024-11-22 DIAGNOSIS — R911 Solitary pulmonary nodule: Secondary | ICD-10-CM

## 2024-11-28 ENCOUNTER — Encounter (HOSPITAL_COMMUNITY): Payer: Self-pay

## 2024-12-06 ENCOUNTER — Encounter: Payer: Self-pay | Admitting: Emergency Medicine

## 2024-12-06 ENCOUNTER — Ambulatory Visit: Admitting: Emergency Medicine

## 2024-12-06 VITALS — BP 134/80 | HR 58 | Temp 97.4°F | Ht 61.0 in | Wt 182.0 lb

## 2024-12-06 DIAGNOSIS — Z87891 Personal history of nicotine dependence: Secondary | ICD-10-CM | POA: Diagnosis not present

## 2024-12-06 DIAGNOSIS — R911 Solitary pulmonary nodule: Secondary | ICD-10-CM

## 2024-12-06 NOTE — Assessment & Plan Note (Addendum)
 Unchanged solitary pulmonary nodule, rounded.  Suspect either granulomatous disease versus possibly benign tumor (carcinoid, hamartoma, etc.).  Did discuss with her that primary lung malignancy is on the differential diagnosis given her tobacco history.  We will plan to repeat her CT at the 43-month mark.  If stable at that time then we will space the follow-up out to 12 months.  We need to establish 2+ years of stability.

## 2024-12-06 NOTE — Progress Notes (Signed)
 "  Subjective:    Patient ID: Cassandra Hicks, female    DOB: 21-May-1956, 69 y.o.   MRN: [id number]  HPI  ROV 12/06/2024 --69 year old woman who follows up today for further evaluation of a pulmonary nodule noted on CT scan of the chest in October 2025.SABRA  She has a history of tobacco use (30 pack years), hypertension, atrial fibrillation. She does have a family hx of lung CA. No cough, mucous. Rare wheeze. She has been on breztri in the past, but did not do reliably.    CT scan of the chest 11/22/2024 reviewed by me, shows calcified granulomas, smoothly marginated send millimeter nodule the superior 7 the left lower lobe (5 x 9 mm) that is unchanged compared with prior.   Review of Systems As per HPI  Past Medical History:  Diagnosis Date   A-fib (HCC)    Abnormal mammogram of left breast 09/17/2016   Anxiety    Breast mass, left    GERD (gastroesophageal reflux disease)    mild, no meds, diet controlled   Heart murmur    as child, no problem   Hypertension    Missed ab    x 2  D&E   SVD (spontaneous vaginal delivery)    x 2    Family History  Problem Relation Age of Onset   COPD Mother    Cancer Father        lung   Hypertension Father    Hypertension Sister    Hypertension Brother      Social History   Socioeconomic History   Marital status: Married    Spouse name: Not on file   Number of children: Not on file   Years of education: Not on file   Highest education level: Not on file  Occupational History   Not on file  Tobacco Use   Smoking status: Former    Current packs/day: 0.00    Average packs/day: 1 pack/day for 30.0 years (30.0 ttl pk-yrs)    Types: Cigarettes    Start date: 12/07/1966    Quit date: 12/07/1996    Years since quitting: 28.0   Smokeless tobacco: Never   Tobacco comments:    Former smoker 06/25/24  Substance and Sexual Activity   Alcohol use: Yes    Alcohol/week: 1.0 standard drink of alcohol    Types: 1 Cans of beer per week     Comment: social/weekends   Drug use: Yes    Types: Marijuana    Comment: every   Sexual activity: Yes    Birth control/protection: Post-menopausal  Other Topics Concern   Not on file  Social History Narrative   Not on file   Social Drivers of Health   Tobacco Use: Medium Risk (12/06/2024)   Patient History    Smoking Tobacco Use: Former    Smokeless Tobacco Use: Never    Passive Exposure: Not on Actuary Strain: Not on file  Food Insecurity: Not on file  Transportation Needs: Not on file  Physical Activity: Not on file  Stress: Not on file  Social Connections: Not on file  Intimate Partner Violence: Not on file  Depression (EYV7-0): Not on file  Alcohol Screen: Not on file  Housing: Not on file  Utilities: Not on file  Health Literacy: Not on file    Allergies[1]  Medications Ordered Prior to Encounter[2]     Objective:    Vitals:   12/06/24 1302  BP: 134/80  Pulse: ROLLEN)  58  Temp: (!) 97.4 F (36.3 C)  SpO2: 98%  Weight: 182 lb (82.6 kg)  Height: 5' 1 (1.549 m)   Physical Exam Gen: Pleasant, overweight woman, in no distress,  normal affect  ENT: No lesions,  mouth clear,  oropharynx clear, no postnasal drip  Neck: No JVD, no stridor  Lungs: No use of accessory muscles, no crackles or wheezing on normal respiration, no wheeze on forced expiration  Cardiovascular: Regular, borderline bradycardia, heart sounds normal, no murmur or gallops, trace pretibial peripheral edema  Musculoskeletal: No deformities, no cyanosis or clubbing  Neuro: alert, awake, non focal  Skin: Warm, no lesions or rashes       Assessment & Plan:   Assessment & Plan Pulmonary nodule Unchanged solitary pulmonary nodule, rounded.  Suspect either granulomatous disease versus possibly benign tumor (carcinoid, hamartoma, etc.).  Did discuss with her that primary lung malignancy is on the differential diagnosis given her tobacco history.  We will plan to repeat  her CT at the 51-month mark.  If stable at that time then we will space the follow-up out to 12 months.  We need to establish 2+ years of stability. History of tobacco use She has not smoked in many years.  Minimal symptoms, does sometimes hear wheezing.  Has been on Breztri in the past but did not feel that she benefited, did not take it reliably.  I think we can hold off on BD therapy for now.  We may decide to perform PFT, reconsider inhaled medication at some point going forward depending on how symptoms evolve.     Return in about 6 months (around 06/05/2025) for With Dr. Shelah, To review CT scan of the chest.  I personally spent a total of 34 minutes in the care of the patient today including preparing to see the patient, getting/reviewing separately obtained history, performing a medically appropriate exam/evaluation, counseling and educating, placing orders, documenting clinical information in the EHR, independently interpreting results, and communicating results.   Lamar Shelah, MD, PhD 12/06/2024, 1:32 PM [facility] [phone] or if no answer before 7:00PM call [phone] For any issues after 7:00PM please call eLink [phone]      [1]  Allergies Allergen Reactions   Penicillins Hives  [2]  Current Outpatient Medications on File Prior to Visit  Medication Sig Dispense Refill   acetaminophen  (TYLENOL ) 500 MG tablet Take 1,000 mg by mouth every 6 (six) hours as needed for moderate pain.     amiodarone  (PACERONE ) 200 MG tablet Take 1 tablet (200 mg total) by mouth daily. 90 tablet 3   atorvastatin (LIPITOR) 10 MG tablet Take 10 mg by mouth daily.     cholecalciferol (VITAMIN D3) 25 MCG (1000 UNIT) tablet Take 2,000 Units by mouth daily.     furosemide  (LASIX ) 20 MG tablet TAKE 1 TABLET (20 MG TOTAL) BY MOUTH AS DIRECTED. TAKE 1 TABLET DAILY THOUGH 08/26/24, THEN ONLY ONCE DAILY AS NEEDED FOR WEIGHT GAIN OF 3-5LB, WORSENING SHORTNESS OF BREATH, OR  SWELLING. 180 tablet 3   losartan -hydrochlorothiazide  (HYZAAR) 50-12.5 MG tablet Take 0.5 tablets by mouth every morning. (Patient taking differently: Take 1 tablet by mouth daily.)     nitroGLYCERIN  (NITROSTAT ) 0.4 MG SL tablet Place 0.4 mg under the tongue every 5 (five) minutes as needed for chest pain.     Prenatal Vit-Fe Fumarate-FA (PRENATAL PO) Take 1 tablet by mouth daily.     rivaroxaban  (XARELTO ) 20 MG TABS tablet Take 1 tablet (20 mg  total) by mouth daily with supper. 30 tablet 5   No current facility-administered medications on file prior to visit.   "

## 2024-12-06 NOTE — Assessment & Plan Note (Addendum)
 She has not smoked in many years.  Minimal symptoms, does sometimes hear wheezing.  Has been on Breztri in the past but did not feel that she benefited, did not take it reliably.  I think we can hold off on BD therapy for now.  We may decide to perform PFT, reconsider inhaled medication at some point going forward depending on how symptoms evolve.

## 2024-12-06 NOTE — Patient Instructions (Signed)
 We reviewed your CT scan of the chest done in January 2026.  Your pulmonary nodules stable in size and appearance.  Good news. We will repeat your CT chest in July 2026. Follow Dr. Shelah in July so we can review your scan together.

## 2024-12-11 ENCOUNTER — Ambulatory Visit (HOSPITAL_COMMUNITY): Admitting: Physician Assistant

## 2024-12-12 ENCOUNTER — Ambulatory Visit (HOSPITAL_COMMUNITY)
Admission: RE | Admit: 2024-12-12 | Discharge: 2024-12-12 | Attending: Physician Assistant | Admitting: Physician Assistant

## 2024-12-12 VITALS — BP 140/70 | HR 53 | Ht 61.0 in | Wt 183.6 lb

## 2024-12-12 DIAGNOSIS — Z5181 Encounter for therapeutic drug level monitoring: Secondary | ICD-10-CM | POA: Diagnosis not present

## 2024-12-12 DIAGNOSIS — Z79899 Other long term (current) drug therapy: Secondary | ICD-10-CM

## 2024-12-12 DIAGNOSIS — I4819 Other persistent atrial fibrillation: Secondary | ICD-10-CM | POA: Diagnosis not present

## 2024-12-12 DIAGNOSIS — D6869 Other thrombophilia: Secondary | ICD-10-CM | POA: Diagnosis not present

## 2024-12-12 NOTE — Progress Notes (Signed)
 "   Primary Care Physician: Burney Darice CROME, MD Primary Cardiologist: Arun K Thukkani, MD Electrophysiologist: Soyla [name], MD  Referring Physician: [facility] triage    Cassandra Hicks is a 69 y.o. female with a history of HTN, previous tobacco use, atrial fibrillation who presents for follow up in the [facility].  The patient was initially diagnosed with atrial fibrillation early 2024 by her PCP. She was seen by Dr Ronal Ross and underwent TEE/DCCV on 03/28/23. Patient is on Xarelto  for stroke prevention.    Patient returns for follow up for atrial fibrillation and amiodarone  monitoring. Discussed the use of AI scribe software for clinical note transcription with the patient, who gave verbal consent to proceed.  She has a history of atrial fibrillation and underwent cardioversion on July 02, 2024. She reverted to AFib by her follow-up visit on July 25, 2024, and was sent to the emergency room where she was started on amiodarone . She had another cardioversion on August 21, 2024, but returned to the emergency room on August 24, 2024, with acute shortness of breath and chest tightness, and was found to be mildly fluid overloaded. She underwent AFib ablation on November 13, 2024.  Since the ablation, she experiences occasional sensations of her heart 'flipping' and shortness of breath with certain movements, such as going up hills or bending over. These episodes are brief and not as severe as previous episodes that required emergency care.   Post-ablation, she experienced significant groin and abdominal discomfort, described as cramping, which lasted for about two weeks, now resolved.   She has noticed a subjective weight gain of at least ten pounds over the last several weeks, despite taking Lasix  intermittently. No leg swelling or difficulty breathing when lying down.  Her weight in office today is the same as her visit one month ago. Her blood pressure has  been elevated, and her primary care physician recently increased her losartan  dosage.      Today, she  denies symptoms of chest pain, orthopnea, PND, lower extremity edema, dizziness, presyncope, syncope, snoring, daytime somnolence, bleeding, or neurologic sequela. The patient is tolerating medications without difficulties and is otherwise without complaint today.    Atrial Fibrillation Risk Factors:  she does not have symptoms or diagnosis of sleep apnea. she does not have a history of rheumatic fever. she does have a history of alcohol use. The patient does have a history of early familial atrial fibrillation or other arrhythmias. Brother and two sisters have afib.   Atrial Fibrillation Management history:  Previous antiarrhythmic drugs: amiodarone  Previous cardioversions: 03/28/23, 07/02/24, 08/21/24 Previous ablations: 11/13/24 Anticoagulation history: Xarelto    ROS- All systems are reviewed and negative except as per the HPI above.  Past Medical History:  Diagnosis Date   A-fib (HCC)    Abnormal mammogram of left breast 09/17/2016   Anxiety    Breast mass, left    GERD (gastroesophageal reflux disease)    mild, no meds, diet controlled   Heart murmur    as child, no problem   Hypertension    Missed ab    x 2  D&E   SVD (spontaneous vaginal delivery)    x 2    Current Outpatient Medications  Medication Sig Dispense Refill   acetaminophen  (TYLENOL ) 500 MG tablet Take 1,000 mg by mouth every 6 (six) hours as needed for moderate pain. (Patient taking differently: Take 1,000 mg by mouth as needed for moderate pain (pain score 4-6).)  amiodarone  (PACERONE ) 200 MG tablet Take 1 tablet (200 mg total) by mouth daily. 90 tablet 3   atorvastatin (LIPITOR) 10 MG tablet Take 10 mg by mouth daily.     cholecalciferol (VITAMIN D3) 25 MCG (1000 UNIT) tablet Take 2,000 Units by mouth daily.     furosemide  (LASIX ) 20 MG tablet TAKE 1 TABLET (20 MG TOTAL) BY MOUTH AS DIRECTED. TAKE 1  TABLET DAILY THOUGH 08/26/24, THEN ONLY ONCE DAILY AS NEEDED FOR WEIGHT GAIN OF 3-5LB, WORSENING SHORTNESS OF BREATH, OR SWELLING. (Patient taking differently: Take 20 mg by mouth as needed. Take 1 tablet daily though 08/26/24, then only once daily as needed for weight gain of 3-5lb, worsening shortness of breath, or swelling.) 180 tablet 3   losartan -hydrochlorothiazide  (HYZAAR) 100-12.5 MG tablet Take 1 tablet by mouth daily.     nitroGLYCERIN  (NITROSTAT ) 0.4 MG SL tablet Place 0.4 mg under the tongue every 5 (five) minutes as needed for chest pain.     Prenatal Vit-Fe Fumarate-FA (PRENATAL PO) Take 1 tablet by mouth daily.     rivaroxaban  (XARELTO ) 20 MG TABS tablet Take 1 tablet (20 mg total) by mouth daily with supper. 30 tablet 5   No current facility-administered medications for this encounter.    Physical Exam: BP (!) 140/70   Pulse (!) 53   Ht 5' 1 (1.549 m)   Wt 83.3 kg   BMI 34.69 kg/m   GEN: Well nourished, well developed in no acute distress CARDIAC: Regular rate and rhythm, no murmurs, rubs, gallops RESPIRATORY:  Clear to auscultation without rales, wheezing or rhonchi  ABDOMEN: Soft, non-tender, non-distended EXTREMITIES:  No edema; No deformity    Wt Readings from Last 3 Encounters:  12/12/24 83.3 kg  12/06/24 82.6 kg  11/14/24 83.9 kg     EKG Interpretation Date/Time:  Wednesday December 12 2024 14:29:45 EST Ventricular Rate:  53 PR Interval:  220 QRS Duration:  82 QT Interval:  484 QTC Calculation: 454 R Axis:   55  Text Interpretation: Sinus bradycardia with 1st degree A-V block Low voltage QRS Borderline ECG When compared with ECG of 13-Nov-2024 08:53, No significant change was found Confirmed by Lizandro Spellman ([id number]) on 12/12/2024 2:30:48 PM    Echo 06/20/24 demonstrated   1. Left ventricular ejection fraction, by estimation, is 55 to 60%. The  left ventricle has normal function. The left ventricle has no regional  wall motion abnormalities. Left  ventricular diastolic function could not  be evaluated.   2. Right ventricular systolic function is normal. The right ventricular  size is normal.   3. Left atrial size was mildly dilated.   4. The mitral valve is normal in structure. Mild mitral valve  regurgitation. No evidence of mitral stenosis.   5. The aortic valve is tricuspid. Aortic valve regurgitation is trivial.  No aortic stenosis is present.   6. The inferior vena cava is normal in size with greater than 50%  respiratory variability, suggesting right atrial pressure of 3 mmHg.    CHA2DS2-VASc Score = 3  The patient's score is based upon: CHF History: 0 HTN History: 1 Diabetes History: 0 Stroke History: 0 Vascular Disease History: -- (atherosclerosis on CT) Age Score: 1 Gender Score: 1       ASSESSMENT AND PLAN: Persistent Atrial Fibrillation (ICD10:  I48.19) The patient's CHA2DS2-VASc score is 3, indicating a 3.2% annual risk of stroke.   S/p afib ablation 11/13/24 Patient appears to be maintaining SR Continue amiodarone  200 mg daily for now.  Continue Xarelto  20 mg daily with no missed doses for 3 months post ablation.   Secondary Hypercoagulable State (ICD10:  D68.69) The patient is at significant risk for stroke/thromboembolism based upon her CHA2DS2-VASc Score of 3.  Continue Rivaroxaban  (Xarelto ). No bleeding issues.   High Risk Medication Monitoring (ICD 10: J342684) Patient requires ongoing monitoring for anti-arrhythmic medication which has the potential to cause life threatening arrhythmias. Intervals on ECG acceptable for amiodarone  monitoring.   HTN Stable on current regimen. Medication recently adjusted by PCP.   Follow up in the AF clinic in 2 months.     [facility] [address] [place], [place] [address] [phone] "

## 2025-02-11 ENCOUNTER — Ambulatory Visit (HOSPITAL_COMMUNITY): Admitting: Physician Assistant
